# Patient Record
Sex: Female | Born: 2000 | Race: Black or African American | Hispanic: No | Marital: Single | Smoking: Never smoker
Health system: Southern US, Community
[De-identification: ages and names within clinical notes are randomized; demographics above are authoritative.]

## PROBLEM LIST (undated history)

## (undated) DIAGNOSIS — Z789 Other specified health status: Secondary | ICD-10-CM

## (undated) HISTORY — PX: OTHER SURGICAL HISTORY: SHX169

## (undated) HISTORY — DX: Other specified health status: Z78.9

---
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
## 2018-01-18 ENCOUNTER — Ambulatory Visit
Admission: EM | Admit: 2018-01-18 | Discharge: 2018-01-18 | Disposition: A | Discharge status: Home / Self Care | Payer: Medicaid Other | Attending: Family Medicine | Admitting: Family Medicine

## 2018-01-18 ENCOUNTER — Encounter: Payer: Self-pay | Admitting: *Deleted

## 2018-01-18 DIAGNOSIS — R112 Nausea with vomiting, unspecified: Secondary | ICD-10-CM

## 2018-01-18 DIAGNOSIS — Z87891 Personal history of nicotine dependence: Secondary | ICD-10-CM | POA: Insufficient documentation

## 2018-01-18 DIAGNOSIS — J029 Acute pharyngitis, unspecified: Secondary | ICD-10-CM | POA: Diagnosis present

## 2018-01-18 DIAGNOSIS — J069 Acute upper respiratory infection, unspecified: Secondary | ICD-10-CM | POA: Insufficient documentation

## 2018-01-18 DIAGNOSIS — R0981 Nasal congestion: Secondary | ICD-10-CM | POA: Diagnosis present

## 2018-01-18 LAB — RAPID STREP SCREEN (MED CTR MEBANE ONLY): Streptococcus, Group A Screen (Direct): NEGATIVE

## 2018-01-18 MED ORDER — BENZONATATE 100 MG PO CAPS: 100.0000 mg | ORAL_CAPSULE | Freq: Three times a day (TID) | ORAL | 0 refills | Status: DC | PRN

## 2018-01-18 NOTE — Discharge Instructions (Signed)
Take medication as prescribed. Over-the-counter medication as discussed. Rest. Drink plenty of fluids.   Follow up with your primary care physician this week as needed. Return to Urgent care for new or worsening concerns.   

## 2018-01-18 NOTE — ED Triage Notes (Signed)
Sore throat, N/V, head congestion, x3 days.

## 2018-01-18 NOTE — ED Provider Notes (Signed)
[facility] ____________________________________________  Time seen: Approximately 3:00 PM  I have reviewed the triage vital signs and the nursing notes.   HISTORY  Chief Complaint Nasal Congestion; Nausea; Emesis; and Sore Throat   HPI Alison Hebert is a 17 y.o. female  presenting with family at bedside for evaluation of 3.5 days of runny nose, nasal congestion, sore throat, body aches and overall not feeling well.  Patient reports some nausea, no vomiting.  Denies diarrhea or abdominal pain.  Continues with normal bowel movements.Reports current generalized body aches.  States sore throat is currently mild and scratchy.  Reports some intermittent bilateral ear discomfort.  Denies home sick contacts.  Continues to drink fluids well, decreased appetite.  States did take some over-the-counter combination cough and congestion medication that may have had Tylenol in it over the last 2 days.  No over-the-counter medication taken just prior to arrival.  Denies other aggravating or alleviating factors.Denies chest pain, shortness of breath, abdominal pain, dysuria, extremity pain, extremity swelling or rash. Denies recent sickness. Denies recent antibiotic use.   Last menstrual: 3 weeks ago.  Denies pregnancy.    History reviewed. No pertinent past medical history.  There are no active problems to display for this patient.   History reviewed. No pertinent surgical history.   No current facility-administered medications for this encounter.   Current Outpatient Medications:  .  benzonatate (TESSALON PERLES) 100 MG capsule, Take 1 capsule (100 mg total) by mouth 3 (three) times daily as needed for cough., Disp: 15 capsule, Rfl: 0  Allergies Patient has no known allergies.  Family History  Problem Relation Age of Onset  . Hypertension Mother   . Hypertension Father     Social History Social History   Tobacco Use  . Smoking status: Former Games developermoker  . Smokeless tobacco:  Never Used  Substance Use Topics  . Alcohol use: No    Frequency: Never  . Drug use: No    Review of Systems Constitutional: No fever/chills ENT: As above Cardiovascular: Denies chest pain. Respiratory: Denies shortness of breath. Gastrointestinal: No abdominal pain. No vomiting.  No diarrhea.  No constipation. Genitourinary: Negative for dysuria. Musculoskeletal: Negative for back pain. Skin: Negative for rash.   ____________________________________________   PHYSICAL EXAM:  VITAL SIGNS: ED Triage Vitals  Enc Vitals Group     BP 01/18/18 1442 129/89     Pulse Rate 01/18/18 1442 90     Resp 01/18/18 1442 16     Temp 01/18/18 1442 97.7 F (36.5 C)     Temp Source 01/18/18 1442 Oral     SpO2 01/18/18 1442 100 %     Weight 01/18/18 1444 130 lb (59 kg)     Height 01/18/18 1444 5\' 1"  (1.549 m)     Head Circumference --      Peak Flow --      Pain Score --      Pain Loc --      Pain Edu? --      Excl. in GC? --     Constitutional: Alert and oriented. Well appearing and in no acute distress. Eyes: Conjunctivae are normal.  Head: Atraumatic. No sinus tenderness to palpation. No swelling. No erythema.  Ears: no erythema, normal TMs bilaterally.   Nose:Nasal congestion with clear rhinorrhea  Mouth/Throat: Mucous membranes are moist. No pharyngeal erythema. No tonsillar swelling or exudate.  Neck: No stridor.  No cervical spine tenderness to palpation. Hematological/Lymphatic/Immunilogical: No cervical lymphadenopathy. Cardiovascular: Normal rate, regular rhythm.  Grossly normal heart sounds.  Good peripheral circulation. Respiratory: Normal respiratory effort.  No retractions. No wheezes, rales or rhonchi. Good air movement.  Gastrointestinal: Soft and nontender.  Musculoskeletal: Ambulatory with steady gait. No cervical, thoracic or lumbar tenderness to palpation. Neurologic:  Normal speech and language. No gait instability. Skin:  Skin appears warm, dry and intact. No  rash noted. Psychiatric: Mood and affect are normal. Speech and behavior are normal.  ___________________________________________   LABS (all labs ordered are listed, but only abnormal results are displayed)  Labs Reviewed  RAPID STREP SCREEN (NOT AT [facility])  CULTURE, GROUP A STREP [facility])    PROCEDURES Procedures    INITIAL IMPRESSION / ASSESSMENT AND PLAN / ED COURSE  Pertinent labs & imaging results that were available during my care of the patient were reviewed by me and considered in my medical decision making (see chart for details).  Well-appearing patient.  No acute distress.  Suspect viral illness.  Discussed some concern of influenza-like illness, however his symptoms have been persisting for 3.5 days, doubt benefit with Tamiflu.  Quick strep negative, will culture.  Discussed supportive care, rest, fluids.  PRN Tessalon Perles and over-the-counter decongestants.Discussed indication, risks and benefits of medications with patient.  Discussed follow up with Primary care physician this week. Discussed follow up and return parameters including no resolution or any worsening concerns. Patient verbalized understanding and agreed to plan.   ____________________________________________   FINAL CLINICAL IMPRESSION(S) / ED DIAGNOSES  Final diagnoses:  Upper respiratory tract infection, unspecified type     ED Discharge Orders        Ordered    benzonatate (TESSALON PERLES) 100 MG capsule  3 times daily PRN     01/18/18 1503       Note: This dictation was prepared with Dragon dictation along with smaller phrase technology. Any transcriptional errors that result from this process are unintentional.         Renford Dills, NP 01/18/18 1526

## 2018-01-21 LAB — CULTURE, GROUP A STREP (THRC)

## 2018-06-03 ENCOUNTER — Other Ambulatory Visit: Payer: Self-pay

## 2018-06-03 ENCOUNTER — Emergency Department
Admission: EM | Admit: 2018-06-03 | Discharge: 2018-06-03 | Disposition: A | Discharge status: Home / Self Care | Payer: Medicaid Other | Attending: Emergency Medicine | Admitting: Emergency Medicine

## 2018-06-03 DIAGNOSIS — R21 Rash and other nonspecific skin eruption: Secondary | ICD-10-CM | POA: Diagnosis not present

## 2018-06-03 DIAGNOSIS — Z87891 Personal history of nicotine dependence: Secondary | ICD-10-CM | POA: Diagnosis not present

## 2018-06-03 MED ORDER — PERMETHRIN 5 % EX CREA: TOPICAL_CREAM | CUTANEOUS | 1 refills | Status: DC

## 2018-06-03 MED ORDER — DIPHENHYDRAMINE HCL 25 MG PO CAPS: 25.0000 mg | ORAL_CAPSULE | ORAL | 0 refills | Status: DC | PRN

## 2018-06-03 MED ORDER — PREDNISONE 10 MG PO TABS: ORAL_TABLET | ORAL | 0 refills | Status: DC

## 2018-06-03 NOTE — ED Provider Notes (Signed)
[facility] Emergency Department Provider Note  ____________________________________________  Time seen: Approximately 2:08 PM  I have reviewed the triage vital signs and the nursing notes.   HISTORY  Chief Complaint Rash    HPI Alison Hebert is a 17 y.o. female that presents emergency department for evaluation of itchy bumps to left arm.  Patient noticed the rash while at work.  She folds clothes at work.  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  She was outside when she noticed the rash.  No known insect bites. No poison ivy. No new lotions, detergents, body washes, medications.    No past medical history on file.  There are no active problems to display for this patient.   No past surgical history on file.  Prior to Admission medications   Medication Sig Start Date End Date Taking? Authorizing Provider  benzonatate (TESSALON PERLES) 100 MG capsule Take 1 capsule (100 mg total) by mouth 3 (three) times daily as needed for cough. 01/18/18   Renford Dills, NP  diphenhydrAMINE (BENADRYL) 25 mg capsule Take 1 capsule (25 mg total) by mouth every 4 (four) hours as needed. 06/03/18 06/03/19  Enid Derry, PA-C  permethrin (ELIMITE) 5 % cream Message cream thoroughly into skin from neck to soles of feet, including under nails and to scalp and face. Wash after 8-14 hours. Repeat in 2 weeks. 06/03/18   Enid Derry, PA-C  predniSONE (DELTASONE) 10 MG tablet Take 6 tablets on day 1, take 5 tablets on day 2, take 4 tablets on day 3, take 3 tablets on day 4, take 2 tablets on day 5, take 1 tablet on day 6 06/03/18   Enid Derry, PA-C    Allergies Patient has no known allergies.  Family History  Problem Relation Age of Onset  . Hypertension Mother   . Hypertension Father     Social History Social History   Tobacco Use  . Smoking status: Former Games developer  . Smokeless tobacco: Never Used  Substance Use Topics  . Alcohol use: No    Frequency: Never  . Drug  use: No     Review of Systems  Constitutional: No fever/chills Cardiovascular: No chest pain. Respiratory:  No SOB. Gastrointestinal: No nausea, no vomiting.  Musculoskeletal: Negative for musculoskeletal pain. Skin: Negative for abrasions, lacerations, ecchymosis. Positive for rash.   ____________________________________________   PHYSICAL EXAM:  VITAL SIGNS: ED Triage Vitals [06/03/18 0910]  Enc Vitals Group     BP 140/78     Pulse Rate 74     Resp 18     Temp 97.9 F (36.6 C)     Temp Source Oral     SpO2 100 %     Weight 170 lb (77.1 kg)     Height 5\' 1"  (1.549 m)     Head Circumference      Peak Flow      Pain Score 0     Pain Loc      Pain Edu?      Excl. in GC?      Constitutional: Alert and oriented. Well appearing and in no acute distress. Eyes: Conjunctivae are normal. PERRL. EOMI. Head: Atraumatic. ENT:      Ears:      Nose: No congestion/rhinnorhea.      Mouth/Throat: Mucous membranes are moist.  Neck: No stridor.   Cardiovascular: Normal rate, regular rhythm.  Good peripheral circulation. Respiratory: Normal respiratory effort without tachypnea or retractions. Lungs CTAB. Good air entry to the  bases with no decreased or absent breath sounds. Musculoskeletal: Full range of motion to all extremities. No gross deformities appreciated. Neurologic:  Normal speech and language. No gross focal neurologic deficits are appreciated.  Skin:  Skin is warm, dry and intact. Few pinpoint flesh colored papules scattered to left arm.  Psychiatric: Mood and affect are normal. Speech and behavior are normal. Patient exhibits appropriate insight and judgement.   ____________________________________________   LABS (all labs ordered are listed, but only abnormal results are displayed)  Labs Reviewed - No data to display ____________________________________________  EKG   ____________________________________________  RADIOLOGY   No results  found.  ____________________________________________    PROCEDURES  Procedure(s) performed:    Procedures    Medications - No data to display   ____________________________________________   INITIAL IMPRESSION / ASSESSMENT AND PLAN / ED COURSE  Pertinent labs & imaging results that were available during my care of the patient were reviewed by me and considered in my medical decision making (see chart for details).  Review of the [place] CSRS was performed in accordance of the [facility] prior to dispensing any controlled drugs.     Patient's diagnosis is consistent with contact dermatitis  Vital signs and exam are reassuring.  Rash could also be insect bites.  Patient will begin prednisone and Benadryl for symptoms.   We discussed possible scabies but less likely given duration of rash.  She will try [place] cream if rash does not improve with prednisone and Benadryl.   Patient is to follow up with dermatology as directed. Patient is given ED precautions to return to the ED for any worsening or new symptoms.     ____________________________________________  FINAL CLINICAL IMPRESSION(S) / ED DIAGNOSES  Final diagnoses:  Rash and nonspecific skin eruption      NEW MEDICATIONS STARTED DURING THIS VISIT:     This chart was dictated using voice recognition software/Dragon. Despite best efforts to proofread, errors can occur which can change the meaning. Any change was purely unintentional.    Enid DerryWagner, Hermann Dottavio, PA-C 06/04/18 1433    Pershing ProudSchaevitz, Myra Rudeavid Matthew, MD 06/04/18 1500

## 2018-06-03 NOTE — ED Triage Notes (Signed)
Pt reports noticing a rash to her left forearm and left shoulder that started around 0400, small bumps noted, denies any known bites or stings, states that the area itches and burns

## 2018-06-03 NOTE — ED Notes (Signed)
Patient presents to the ED with rash to left arm radiating up to her left shoulder.  Patient states she first noticed area around 4am.  Patient reports itching and stinging.  Patient is in no obvious distress at this time.  Denies new soaps or detergents or foods.  Will continue to monitor.

## 2018-10-22 ENCOUNTER — Emergency Department
Admission: EM | Admit: 2018-10-22 | Discharge: 2018-10-22 | Disposition: A | Discharge status: Home / Self Care | Payer: Medicaid Other | Attending: Emergency Medicine | Admitting: Emergency Medicine

## 2018-10-22 DIAGNOSIS — H66001 Acute suppurative otitis media without spontaneous rupture of ear drum, right ear: Secondary | ICD-10-CM | POA: Insufficient documentation

## 2018-10-22 MED ORDER — AMOXICILLIN 500 MG PO TABS: 500.0000 mg | ORAL_TABLET | Freq: Three times a day (TID) | ORAL | 0 refills | Status: DC

## 2018-10-22 MED ORDER — NAPROXEN 500 MG PO TABS: 500.0000 mg | ORAL_TABLET | Freq: Two times a day (BID) | ORAL | 0 refills | Status: DC

## 2018-10-22 NOTE — Discharge Instructions (Signed)
Please follow up with the primary care provider for symptoms that are not improving over the next few days.  Return to the ER for symptoms that change or worsen if unable to schedule an appointment. 

## 2018-10-22 NOTE — ED Provider Notes (Signed)
[facility] Emergency Department Provider Note ____________________________________________  Time seen: Approximately 7:37 AM  I have reviewed the triage vital signs and the nursing notes.   HISTORY  Chief Complaint Otalgia and Nasal Congestion    HPI Alison Hebert is a 17 y.o. female who presents to the emergency department for treatment and evaluation of right ear pain that started around 1230.  No drainage from the ear.  She has had sinus congestion for the past week.  No fever. No alleviating measures attempted prior to arrival.   History reviewed. No pertinent past medical history.  There are no active problems to display for this patient.   History reviewed. No pertinent surgical history.  Prior to Admission medications   Medication Sig Start Date End Date Taking? Authorizing Provider  amoxicillin (AMOXIL) 500 MG tablet Take 1 tablet (500 mg total) by mouth 3 (three) times daily. 10/22/18   Marice Guidone B, FNP  benzonatate (TESSALON PERLES) 100 MG capsule Take 1 capsule (100 mg total) by mouth 3 (three) times daily as needed for cough. 01/18/18   Renford DillsMiller, Lindsey, NP  diphenhydrAMINE (BENADRYL) 25 mg capsule Take 1 capsule (25 mg total) by mouth every 4 (four) hours as needed. 06/03/18 06/03/19  Enid DerryWagner, Ashley, PA-C  naproxen (NAPROSYN) 500 MG tablet Take 1 tablet (500 mg total) by mouth 2 (two) times daily with a meal. 10/22/18   Tahmir Kleckner B, FNP  permethrin (ELIMITE) 5 % cream Message cream thoroughly into skin from neck to soles of feet, including under nails and to scalp and face. Wash after 8-14 hours. Repeat in 2 weeks. 06/03/18   Enid DerryWagner, Ashley, PA-C  predniSONE (DELTASONE) 10 MG tablet Take 6 tablets on day 1, take 5 tablets on day 2, take 4 tablets on day 3, take 3 tablets on day 4, take 2 tablets on day 5, take 1 tablet on day 6 06/03/18   Enid DerryWagner, Ashley, PA-C    Allergies Patient has no known allergies.  Family History  Problem Relation Age  of Onset  . Hypertension Mother   . Hypertension Father     Social History Social History   Tobacco Use  . Smoking status: Never Smoker  . Smokeless tobacco: Never Used  Substance Use Topics  . Alcohol use: No    Frequency: Never  . Drug use: No    Review of Systems Constitutional: Negative for fever. Negative for decreased ability to hear from right or left ear(s). Eyes: Negative for discharge or drainage. ENT:       Positive for otalgia in right ear(s).      Negative for rhinorrhea or congestion.      Negative for sore throat. Gastrointestinal: Negative for nausea, vomiting, or diarrhea. Musculoskeletal: Negative for myalgias. Skin: Negative for rash, lesions, or wounds. Neurological: Negative for paresthesias. ____________________________________________   PHYSICAL EXAM:  VITAL SIGNS: ED Triage Vitals  Enc Vitals Group     BP 10/22/18 0454 (!) 143/79     Pulse Rate 10/22/18 0454 81     Resp 10/22/18 0454 15     Temp 10/22/18 0454 99 F (37.2 C)     Temp Source 10/22/18 0454 Oral     SpO2 10/22/18 0454 99 %     Weight 10/22/18 0455 165 lb (74.8 kg)     Height 10/22/18 0455 5\' 2"  (1.575 m)     Head Circumference --      Peak Flow --      Pain Score 10/22/18 0455 7  Pain Loc --      Pain Edu? --      Excl. in GC? --     Constitutional: Well appearing. Eyes: Conjunctivae are clear without discharge or drainage. Ears:       Right TM: Dull, erythematous.      Left TM: Normal. Head: Atraumatic. Nose: No rhinorrhea or sinus pain on percussion. Mouth/Throat: Oropharynx normal. Tonsils without without exudate. Hematological/Lymphatic/Immunilogical: No palpable anterior cervical lymphadenopathy. Cardiovascular: Heart rate and rhythm are regular without murmur, gallop, or rub appreciated. Respiratory: Breath sounds are clear throughout to auscultation.  Neurologic:  Alert and oriented x 4. Skin: Intact and without rash, lesion, or wound on exposed skin  surfaces. ____________________________________________   LABS (all labs ordered are listed, but only abnormal results are displayed)  Labs Reviewed - No data to display ____________________________________________   RADIOLOGY  Not indicated ____________________________________________   PROCEDURES  Procedure(s) performed:   Procedures  ____________________________________________   INITIAL IMPRESSION / ASSESSMENT AND PLAN / ED COURSE  17 year old female presenting to the emergency department for treatment and evaluation of right earache that awakened her early this morning.  She is had a URI for the past several days, but the earache did not start until this morning.  Exam is consistent with a right otitis media.  She will be treated with amoxicillin and Naprosyn.  She was advised to follow-up with her primary care provider for symptoms that are not improving over the next few days.  She was advised to return to the emergency department for symptoms change or worsen if she is unable to schedule an appointment.  Pertinent labs & imaging results that were available during my care of the patient were reviewed by me and considered in my medical decision making (see chart for details). ____________________________________________   FINAL CLINICAL IMPRESSION(S) / ED DIAGNOSES  Final diagnoses:  Non-recurrent acute suppurative otitis media of right ear without spontaneous rupture of tympanic membrane    ED Discharge Orders         Ordered    amoxicillin (AMOXIL) 500 MG tablet  3 times daily     10/22/18 0745    naproxen (NAPROSYN) 500 MG tablet  2 times daily with meals     10/22/18 0745          If controlled substance prescribed during this visit, 12 month history viewed on the [place] prior to issuing an initial prescription for Schedule II or III opiod.   Note:  This document was prepared using Dragon voice recognition software and may include unintentional dictation  errors.     Chinita Pester, FNP 10/22/18 4098    Governor Rooks, MD 10/22/18 [phone]

## 2018-10-22 NOTE — ED Notes (Signed)
Right ear pain 8/10. No obvious drainage. Denies injury.

## 2018-10-22 NOTE — ED Triage Notes (Signed)
Patient c/o left ear pain that woke her from sleep at 030 today. Patient denies drainage from ear. Patient c/o sinus congestion X 1 week.

## 2019-01-10 DIAGNOSIS — Z113 Encounter for screening for infections with a predominantly sexual mode of transmission: Secondary | ICD-10-CM | POA: Diagnosis not present

## 2019-01-10 DIAGNOSIS — Z1388 Encounter for screening for disorder due to exposure to contaminants: Secondary | ICD-10-CM | POA: Diagnosis not present

## 2019-01-10 DIAGNOSIS — Z0389 Encounter for observation for other suspected diseases and conditions ruled out: Secondary | ICD-10-CM | POA: Diagnosis not present

## 2019-01-10 DIAGNOSIS — Z3009 Encounter for other general counseling and advice on contraception: Secondary | ICD-10-CM | POA: Diagnosis not present

## 2019-01-10 DIAGNOSIS — Z3049 Encounter for surveillance of other contraceptives: Secondary | ICD-10-CM | POA: Diagnosis not present

## 2019-01-10 LAB — HM HIV SCREENING LAB: HM HIV Screening: NEGATIVE

## 2019-05-08 DIAGNOSIS — Z3009 Encounter for other general counseling and advice on contraception: Secondary | ICD-10-CM | POA: Diagnosis not present

## 2019-05-08 DIAGNOSIS — Z0389 Encounter for observation for other suspected diseases and conditions ruled out: Secondary | ICD-10-CM | POA: Diagnosis not present

## 2019-05-08 DIAGNOSIS — Z1388 Encounter for screening for disorder due to exposure to contaminants: Secondary | ICD-10-CM | POA: Diagnosis not present

## 2019-11-26 ENCOUNTER — Ambulatory Visit: Payer: Medicaid Other

## 2019-12-19 ENCOUNTER — Other Ambulatory Visit: Payer: Self-pay

## 2019-12-24 ENCOUNTER — Ambulatory Visit (LOCAL_COMMUNITY_HEALTH_CENTER): Payer: Medicaid Other

## 2019-12-24 ENCOUNTER — Other Ambulatory Visit: Payer: Self-pay

## 2019-12-24 VITALS — BP 128/82 | Ht 62.0 in | Wt 181.0 lb

## 2019-12-24 DIAGNOSIS — Z3201 Encounter for pregnancy test, result positive: Secondary | ICD-10-CM

## 2019-12-24 LAB — PREGNANCY, URINE: Preg Test, Ur: POSITIVE — AB

## 2019-12-24 NOTE — Progress Notes (Signed)
Pt unsure of prenatal caregiver; sent to preadmit. 

## 2020-01-02 DIAGNOSIS — E669 Obesity, unspecified: Secondary | ICD-10-CM

## 2020-01-02 DIAGNOSIS — Z6830 Body mass index (BMI) 30.0-30.9, adult: Secondary | ICD-10-CM

## 2020-01-08 ENCOUNTER — Ambulatory Visit: Payer: Self-pay | Admitting: Family Medicine

## 2020-01-08 ENCOUNTER — Other Ambulatory Visit: Payer: Self-pay | Admitting: Family Medicine

## 2020-01-08 ENCOUNTER — Other Ambulatory Visit: Payer: Self-pay

## 2020-01-08 VITALS — BP 110/66 | HR 75 | Temp 97.8°F | Wt 182.2 lb

## 2020-01-08 DIAGNOSIS — O99211 Obesity complicating pregnancy, first trimester: Secondary | ICD-10-CM | POA: Insufficient documentation

## 2020-01-08 DIAGNOSIS — Z1331 Encounter for screening for depression: Secondary | ICD-10-CM | POA: Diagnosis not present

## 2020-01-08 DIAGNOSIS — Z3400 Encounter for supervision of normal first pregnancy, unspecified trimester: Secondary | ICD-10-CM | POA: Diagnosis not present

## 2020-01-08 LAB — URINALYSIS
Bilirubin, UA: NEGATIVE
Glucose, UA: NEGATIVE
Ketones, UA: NEGATIVE
Leukocytes,UA: NEGATIVE
Nitrite, UA: NEGATIVE
Protein,UA: NEGATIVE
RBC, UA: NEGATIVE
Specific Gravity, UA: 1.02 (ref 1.005–1.030)
Urobilinogen, Ur: 1 mg/dL (ref 0.2–1.0)
pH, UA: 7 (ref 5.0–7.5)

## 2020-01-08 LAB — WET PREP FOR TRICH, YEAST, CLUE
Trichomonas Exam: NEGATIVE
Yeast Exam: NEGATIVE

## 2020-01-08 LAB — HEMOGLOBIN, FINGERSTICK: Hemoglobin: 12.6 g/dL (ref 11.1–15.9)

## 2020-01-08 LAB — OB RESULTS CONSOLE HIV ANTIBODY (ROUTINE TESTING): HIV: NONREACTIVE

## 2020-01-08 NOTE — Progress Notes (Signed)
[place] [id number] [phone]  INITIAL PRENATAL VISIT NOTE  Subjective:  Alison Hebert is a 19 y.o. G1P0000 at [redacted]w[redacted]d being seen today to start prenatal care at the [facility] Department.  She is currently monitored for the following issues for this low-risk pregnancy and has Obesity complicating pregnancy in first trimester; Supervision of normal first pregnancy, antepartum; and Positive depression screening on their problem list.  Pt is here for initial OB visit. She lives with her mom and just started work at [employer]. The FOB will not be involved in this pregnancy. Physically she is feeling ok, states nausea is resolving. She denies chronic medical issues, takes no medications other than PNV, has no allergies to medications. No hx of pap d/t age <65. She does not drink or use drugs. She used to smoke Black and Milds about every other day but stopped when she learned she was pregnant. She elects for 1st trimester screen.   PHQ-9 score is 11. She endorses stress with this pregnancy and would like to speak with a counselor.      Contractions: Not present. Vag. Bleeding: None.  Movement: Absent. Denies leaking of fluid.   Indications for ASA therapy (per uptodate) One of the following: Previous pregnancy with preeclampsia, especially early onset and with an adverse outcome No Multifetal gestation No Chronic hypertension No Type 1 or 2 diabetes mellitus No Chronic kidney disease No Autoimmune disease (antiphospholipid syndrome, systemic lupus erythematosus) No  Two or more of the following: Nulliparity Yes Obesity (body mass index >30 kg/m2) Yes Family history of preeclampsia in mother or sister No Age ?35 years No Sociodemographic characteristics (African American race, low socioeconomic level) Yes Personal risk factors (eg, previous pregnancy with low birth weight or  small for gestational age infant, previous adverse pregnancy outcome [eg, stillbirth], interval >10 years between pregnancies) No   The following portions of the patient's history were reviewed and updated as appropriate: allergies, current medications, past family history, past medical history, past social history, past surgical history and problem list. Problem list updated.  Objective:   Vitals:   01/08/20 0857  BP: 110/66  Pulse: 75  Temp: 97.8 F (36.6 C)  Weight: 182 lb 3.2 oz (82.6 kg)    Fetal Status: Fetal Heart Rate (bpm): unable to hear Fundal Height: 12 cm Movement: Absent  Presentation: Undeterminable   Physical Exam Vitals and nursing note reviewed.  Constitutional:      General: She is not in acute distress.    Appearance: Normal appearance. She is well-developed.  HENT:     Head: Normocephalic and atraumatic.     Right Ear: External ear normal.     Left Ear: External ear normal.     Nose: Nose normal. No congestion or rhinorrhea.     Mouth/Throat:     Lips: Pink.     Mouth: Mucous membranes are moist.     Dentition: Normal dentition. No dental caries.     Pharynx: Oropharynx is clear. Uvula midline.  Eyes:     General: No scleral icterus.    Conjunctiva/sclera: Conjunctivae normal.  Neck:     Thyroid: No thyroid mass or thyromegaly.  Cardiovascular:     Rate and Rhythm: Normal rate.     Pulses: Normal pulses.     Comments: Extremities are warm and well perfused Pulmonary:     Effort: Pulmonary effort is normal.     Breath  sounds: Normal breath sounds.  Chest:     Breasts: Breasts are symmetrical.        Right: Normal. No mass, nipple discharge or skin change.        Left: Normal. No mass, nipple discharge or skin change.  Abdominal:     General: Abdomen is flat.     Palpations: Abdomen is soft.     Tenderness: There is no abdominal tenderness.     Comments: Gravid   Genitourinary:    General: Normal vulva.     Exam position: Lithotomy position.      Pubic Area: No rash.      Labia:        Right: No rash.        Left: No rash.      Vagina: Vaginal discharge (scant, white) present.     Cervix: No cervical motion tenderness or friability.     Uterus: Normal. Enlarged (Gravid 12 wk size). Not tender.      Adnexa: Right adnexa normal and left adnexa normal.     Rectum: Normal. No external hemorrhoid.  Musculoskeletal:     Right lower leg: No edema.     Left lower leg: No edema.  Lymphadenopathy:     Upper Body:     Right upper body: No axillary adenopathy.     Left upper body: No axillary adenopathy.  Skin:    General: Skin is warm.     Capillary Refill: Capillary refill takes less than 2 seconds.  Neurological:     Mental Status: She is alert.     Assessment and Plan:  Pregnancy: G1P0000 at [redacted]w[redacted]d   1. Supervision of normal first pregnancy, antepartum -Initial prenatal visit today. -Unable to hear FHT and pt elects 1st trimester genetic screen, referral placed for viability/1st trim [place] today.  -Pap n/a d/t age - HGB FRAC. W/SOLUBILITY - HIV [place] [facility] - Prenatal profile without Varicella/Rubella ([id number]) - Urine Culture - Chlamydia/GC NAA, Confirmation - WET PREP FOR TRICH, YEAST, CLUE - Hemoglobin, venipuncture - Urinalysis (Urine Dip)  2. Obesity complicating pregnancy in first trimester -Early GTT today, add'l labs as listed. -Healthy weight gain discussed. Pt accepts MNT referral, placed today -Pt accepts aspirin at 12 wks, handout given. -May consider serial growth scans starting at 28 wks. - Glucose, 1 hour gestational - Hgb A1c w/o eAG - Comprehensive metabolic panel - Protein / creatinine ratio, urine  (Spot) - TSH - Amb ref to Medical Nutrition Therapy-MNT  3. Positive depression screening Upon flowsheet questioning pt endorses stress w/pregnancy and would like to speak with a counselor. Will refer to behavioral health.  - Ambulatory referral to Behavioral Health    Discussed overview of care  and coordination with inpatient delivery practices including [facility], [facility], [facility] and [facility].     Preterm labor symptoms and general obstetric precautions including but not limited to vaginal bleeding, contractions, leaking of fluid and fetal movement were reviewed in detail with the patient.  Please refer to After Visit Summary for other counseling recommendations.   Return in about 4 weeks (around 02/05/2020).  Future Appointments  Date Time Provider Department Center  02/05/2020  8:40 AM AC-MH PROVIDER AC-[facility] None    Ann Held, PA-C

## 2020-01-08 NOTE — Progress Notes (Addendum)
Here today for 12.5 week MH IP appt. Taking PNV QD, denies ED/hospital visits since +PT. Declines Flu vaccine. Early 1 hour GTT today.  Tawny Hopping, RN

## 2020-01-09 LAB — PROTEIN / CREATININE RATIO, URINE
Creatinine, Urine: 92.5 mg/dL
Protein, Ur: 7.8 mg/dL
Protein/Creat Ratio: 84 mg/g creat (ref 0–200)

## 2020-01-10 LAB — COMPREHENSIVE METABOLIC PANEL
ALT: 18 IU/L (ref 0–32)
AST: 20 IU/L (ref 0–40)
Albumin/Globulin Ratio: 1.4 (ref 1.2–2.2)
Albumin: 4.3 g/dL (ref 3.9–5.0)
Alkaline Phosphatase: 76 IU/L (ref 39–117)
BUN/Creatinine Ratio: 8 — ABNORMAL LOW (ref 9–23)
BUN: 5 mg/dL — ABNORMAL LOW (ref 6–20)
Bilirubin Total: <0.2 mg/dL (ref 0.0–1.2)
CO2: 22 mmol/L (ref 20–29)
Calcium: 10 mg/dL (ref 8.7–10.2)
Chloride: 104 mmol/L (ref 96–106)
Creatinine, Ser: 0.61 mg/dL (ref 0.57–1.00)
GFR calc Af Amer: 151 mL/min/{1.73_m2} (ref >59)
GFR calc non Af Amer: 131 mL/min/{1.73_m2} (ref >59)
Globulin, Total: 3.1 g/dL (ref 1.5–4.5)
Glucose: 76 mg/dL (ref 65–99)
Potassium: 4 mmol/L (ref 3.5–5.2)
Sodium: 141 mmol/L (ref 134–144)
Total Protein: 7.4 g/dL (ref 6.0–8.5)

## 2020-01-10 LAB — CBC/D/PLT+RPR+RH+ABO+AB SCR
Antibody Screen: NEGATIVE
Basophils Absolute: 0 10*3/uL (ref 0.0–0.2)
Basos: 0 %
EOS (ABSOLUTE): 0.1 10*3/uL (ref 0.0–0.4)
Eos: 2 %
Hematocrit: 38.8 % (ref 34.0–46.6)
Hemoglobin: 12.9 g/dL (ref 11.1–15.9)
Hepatitis B Surface Ag: NEGATIVE
Immature Grans (Abs): 0 10*3/uL (ref 0.0–0.1)
Immature Granulocytes: 0 %
Lymphocytes Absolute: 1.4 10*3/uL (ref 0.7–3.1)
Lymphs: 18 %
MCH: 27.4 pg (ref 26.6–33.0)
MCHC: 33.2 g/dL (ref 31.5–35.7)
MCV: 82 fL (ref 79–97)
Monocytes Absolute: 0.6 10*3/uL (ref 0.1–0.9)
Monocytes: 8 %
Neutrophils Absolute: 5.5 10*3/uL (ref 1.4–7.0)
Neutrophils: 72 %
Platelets: 265 10*3/uL (ref 150–450)
RBC: 4.71 x10E6/uL (ref 3.77–5.28)
RDW: 12.9 % (ref 11.7–15.4)
RPR Ser Ql: NONREACTIVE
Rh Factor: POSITIVE
WBC: 7.7 10*3/uL (ref 3.4–10.8)

## 2020-01-10 LAB — CHLAMYDIA/GC NAA, CONFIRMATION
Chlamydia trachomatis, NAA: NEGATIVE
Neisseria gonorrhoeae, NAA: NEGATIVE

## 2020-01-10 LAB — HGB FRAC. W/SOLUBILITY
Hgb A2 Quant: 1.8 % (ref 1.8–3.2)
Hgb A: 98.2 % (ref 96.4–98.8)
Hgb C: 0 %
Hgb F Quant: 0 % (ref 0.0–2.0)
Hgb S: 0 %
Hgb Solubility: NEGATIVE
Hgb Variant: 0 %

## 2020-01-10 LAB — HGB A1C W/O EAG: Hgb A1c MFr Bld: 5.2 % (ref 4.8–5.6)

## 2020-01-10 LAB — GLUCOSE, 1 HOUR GESTATIONAL: Gestational Diabetes Screen: 77 mg/dL (ref 65–139)

## 2020-01-10 LAB — URINE CULTURE

## 2020-01-10 LAB — TSH: TSH: 0.938 u[IU]/mL (ref 0.450–4.500)

## 2020-01-15 DIAGNOSIS — Z315 Encounter for genetic counseling: Secondary | ICD-10-CM | POA: Diagnosis not present

## 2020-01-15 DIAGNOSIS — Z3A13 13 weeks gestation of pregnancy: Secondary | ICD-10-CM | POA: Diagnosis not present

## 2020-01-15 DIAGNOSIS — Z36 Encounter for antenatal screening for chromosomal anomalies: Secondary | ICD-10-CM | POA: Diagnosis not present

## 2020-01-15 DIAGNOSIS — Z3401 Encounter for supervision of normal first pregnancy, first trimester: Secondary | ICD-10-CM | POA: Diagnosis not present

## 2020-01-16 DIAGNOSIS — R1084 Generalized abdominal pain: Secondary | ICD-10-CM | POA: Diagnosis not present

## 2020-01-16 DIAGNOSIS — R110 Nausea: Secondary | ICD-10-CM | POA: Diagnosis not present

## 2020-01-16 DIAGNOSIS — O3481 Maternal care for other abnormalities of pelvic organs, first trimester: Secondary | ICD-10-CM | POA: Diagnosis not present

## 2020-01-16 DIAGNOSIS — Z3A13 13 weeks gestation of pregnancy: Secondary | ICD-10-CM | POA: Diagnosis not present

## 2020-01-16 DIAGNOSIS — B9689 Other specified bacterial agents as the cause of diseases classified elsewhere: Secondary | ICD-10-CM | POA: Diagnosis not present

## 2020-01-16 DIAGNOSIS — O23591 Infection of other part of genital tract in pregnancy, first trimester: Secondary | ICD-10-CM | POA: Diagnosis not present

## 2020-01-16 DIAGNOSIS — N83201 Unspecified ovarian cyst, right side: Secondary | ICD-10-CM | POA: Diagnosis not present

## 2020-01-16 DIAGNOSIS — O30041 Twin pregnancy, dichorionic/diamniotic, first trimester: Secondary | ICD-10-CM | POA: Diagnosis not present

## 2020-01-16 DIAGNOSIS — O26851 Spotting complicating pregnancy, first trimester: Secondary | ICD-10-CM | POA: Diagnosis not present

## 2020-01-16 DIAGNOSIS — Z3A08 8 weeks gestation of pregnancy: Secondary | ICD-10-CM | POA: Diagnosis not present

## 2020-01-16 DIAGNOSIS — N760 Acute vaginitis: Secondary | ICD-10-CM | POA: Diagnosis not present

## 2020-01-21 DIAGNOSIS — O3680X1 Pregnancy with inconclusive fetal viability, fetus 1: Secondary | ICD-10-CM | POA: Diagnosis not present

## 2020-01-21 DIAGNOSIS — Z3A09 9 weeks gestation of pregnancy: Secondary | ICD-10-CM | POA: Diagnosis not present

## 2020-01-21 DIAGNOSIS — Z3401 Encounter for supervision of normal first pregnancy, first trimester: Secondary | ICD-10-CM | POA: Diagnosis not present

## 2020-01-21 DIAGNOSIS — O30041 Twin pregnancy, dichorionic/diamniotic, first trimester: Secondary | ICD-10-CM | POA: Diagnosis not present

## 2020-01-21 DIAGNOSIS — O3680X0 Pregnancy with inconclusive fetal viability, not applicable or unspecified: Secondary | ICD-10-CM | POA: Diagnosis not present

## 2020-01-31 ENCOUNTER — Ambulatory Visit: Payer: Medicaid Other | Admitting: Licensed Clinical Social Worker

## 2020-02-05 ENCOUNTER — Ambulatory Visit: Payer: Medicaid Other

## 2020-02-05 DIAGNOSIS — O99351 Diseases of the nervous system complicating pregnancy, first trimester: Secondary | ICD-10-CM | POA: Diagnosis not present

## 2020-02-05 DIAGNOSIS — R42 Dizziness and giddiness: Secondary | ICD-10-CM | POA: Diagnosis not present

## 2020-02-05 DIAGNOSIS — O26891 Other specified pregnancy related conditions, first trimester: Secondary | ICD-10-CM | POA: Diagnosis not present

## 2020-02-05 DIAGNOSIS — R519 Headache, unspecified: Secondary | ICD-10-CM | POA: Diagnosis not present

## 2020-02-05 DIAGNOSIS — Z3A10 10 weeks gestation of pregnancy: Secondary | ICD-10-CM | POA: Diagnosis not present

## 2020-02-05 DIAGNOSIS — G43909 Migraine, unspecified, not intractable, without status migrainosus: Secondary | ICD-10-CM | POA: Diagnosis not present

## 2020-02-07 ENCOUNTER — Other Ambulatory Visit: Payer: Self-pay

## 2020-02-07 ENCOUNTER — Ambulatory Visit: Payer: Medicaid Other | Admitting: Physician Assistant

## 2020-02-07 VITALS — BP 133/88 | HR 105 | Temp 98.9°F | Wt 185.2 lb

## 2020-02-07 DIAGNOSIS — Z3400 Encounter for supervision of normal first pregnancy, unspecified trimester: Secondary | ICD-10-CM | POA: Diagnosis not present

## 2020-02-07 DIAGNOSIS — O30041 Twin pregnancy, dichorionic/diamniotic, first trimester: Secondary | ICD-10-CM | POA: Diagnosis not present

## 2020-02-07 NOTE — Progress Notes (Signed)
   PRENATAL VISIT NOTE  Subjective:  Jennife Zaucha is a 19 y.o. G1P0000 at [redacted]w[redacted]d being seen today for ongoing prenatal care.  She is currently monitored for the following issues for this high-risk pregnancy and has Obesity complicating pregnancy in first trimester; Supervision of normal first pregnancy, antepartum; Positive depression screening; and Dichorionic diamniotic twin pregnancy in first trimester on their problem list.  Of note, pt went to [facility] for eval of abdominal discomfort and ended up having a transvag US on 01/16/20 which showed di/di twin IUP at [redacted]w[redacted]d. Records indicate she was to f/u at [facility] in 1 week, but she presents here for referral for transfer of care. No further abdominal pain, no nausea/vomiting/bleeding. Is aware she is carrying a twin pregnancy.  Patient reports no complaints.  Contractions: Not present. Vag. Bleeding: None.  Movement: Absent. Denies leaking of fluid/ROM.   The following portions of the patient's history were reviewed and updated as appropriate: allergies, current medications, past family history, past medical history, past social history, past surgical history and problem list. Problem list updated.  Objective:   Vitals:   02/07/20 1400  BP: 133/88  Pulse: (!) 105  Temp: 98.9 F (37.2 C)  Weight: 185 lb 3.2 oz (84 kg)    Fetal Status: Fetal Heart Rate (bpm): 148 Fundal Height: 13 cm Movement: Absent     General:  Alert, oriented and cooperative. Patient is in no acute distress.  Skin: Skin is warm and dry. No rash noted.   Cardiovascular: Normal heart rate noted  Respiratory: Normal respiratory effort, no problems with respiration noted  Abdomen: Soft, gravid, appropriate for gestational age.  Pain/Pressure: Absent     Pelvic: Cervical exam deferred        Extremities: Normal range of motion.  Edema: None  Mental Status: Normal mood and affect. Normal behavior. Normal judgment and thought content.   Assessment and Plan:  Pregnancy: G1P0000  at [redacted]w[redacted]d  1. Dichorionic diamniotic twin pregnancy in first trimester Transfer care to [facility] for ongoing prenatal care; referral form completed. Pt to expect call from [facility] to initiate prenatal care for high risk/twin pregnancy. Pt in agreement with this plan.   Preterm labor symptoms and general obstetric precautions including but not limited to vaginal bleeding, contractions, leaking of fluid and fetal movement were reviewed in detail with the patient. Please refer to After Visit Summary for other counseling recommendations.  No follow-ups on file.  No future appointments.  Landry Dyke, PA-C

## 2020-02-07 NOTE — Progress Notes (Addendum)
Here today for 11.4 week MH RV. Taking PNV and ASA QD. Went to [facility] 01/16/20 for abdominal pain and 02/05/20 for Migraines. Was given Tylenol. HA was relieved with Tylenol. Tawny Hopping, RN

## 2020-02-07 NOTE — Addendum Note (Signed)
Addended by: Heywood Bene on: 02/07/2020 11:09 AM   Modules accepted: Orders

## 2020-02-20 DIAGNOSIS — Z3A13 13 weeks gestation of pregnancy: Secondary | ICD-10-CM | POA: Diagnosis not present

## 2020-02-20 DIAGNOSIS — O0991 Supervision of high risk pregnancy, unspecified, first trimester: Secondary | ICD-10-CM | POA: Diagnosis not present

## 2020-02-20 DIAGNOSIS — Z3401 Encounter for supervision of normal first pregnancy, first trimester: Secondary | ICD-10-CM | POA: Diagnosis not present

## 2020-02-20 DIAGNOSIS — O30009 Twin pregnancy, unspecified number of placenta and unspecified number of amniotic sacs, unspecified trimester: Secondary | ICD-10-CM | POA: Diagnosis not present

## 2020-02-20 DIAGNOSIS — O30042 Twin pregnancy, dichorionic/diamniotic, second trimester: Secondary | ICD-10-CM | POA: Diagnosis not present

## 2020-02-20 DIAGNOSIS — O219 Vomiting of pregnancy, unspecified: Secondary | ICD-10-CM | POA: Diagnosis not present

## 2020-02-20 DIAGNOSIS — O30041 Twin pregnancy, dichorionic/diamniotic, first trimester: Secondary | ICD-10-CM | POA: Diagnosis not present

## 2020-03-31 ENCOUNTER — Other Ambulatory Visit: Payer: Self-pay

## 2020-03-31 ENCOUNTER — Encounter: Payer: Self-pay | Admitting: Emergency Medicine

## 2020-03-31 ENCOUNTER — Emergency Department: Payer: Medicaid Other

## 2020-03-31 ENCOUNTER — Emergency Department
Admission: EM | Admit: 2020-03-31 | Discharge: 2020-03-31 | Disposition: A | Discharge status: Home / Self Care | Payer: Medicaid Other | Attending: Emergency Medicine | Admitting: Emergency Medicine

## 2020-03-31 DIAGNOSIS — O26892 Other specified pregnancy related conditions, second trimester: Secondary | ICD-10-CM | POA: Insufficient documentation

## 2020-03-31 DIAGNOSIS — R1031 Right lower quadrant pain: Secondary | ICD-10-CM

## 2020-03-31 DIAGNOSIS — Z3A20 20 weeks gestation of pregnancy: Secondary | ICD-10-CM | POA: Insufficient documentation

## 2020-03-31 DIAGNOSIS — Z79899 Other long term (current) drug therapy: Secondary | ICD-10-CM | POA: Diagnosis not present

## 2020-03-31 DIAGNOSIS — Z7982 Long term (current) use of aspirin: Secondary | ICD-10-CM | POA: Insufficient documentation

## 2020-03-31 DIAGNOSIS — R109 Unspecified abdominal pain: Secondary | ICD-10-CM

## 2020-03-31 DIAGNOSIS — Z3A19 19 weeks gestation of pregnancy: Secondary | ICD-10-CM | POA: Diagnosis not present

## 2020-03-31 LAB — COMPREHENSIVE METABOLIC PANEL
ALT: 14 U/L (ref 0–44)
AST: 17 U/L (ref 15–41)
Albumin: 3.3 g/dL — ABNORMAL LOW (ref 3.5–5.0)
Alkaline Phosphatase: 66 U/L (ref 38–126)
Anion gap: 8 (ref 5–15)
BUN: 6 mg/dL (ref 6–20)
CO2: 21 mmol/L — ABNORMAL LOW (ref 22–32)
Calcium: 9.3 mg/dL (ref 8.9–10.3)
Chloride: 108 mmol/L (ref 98–111)
Creatinine, Ser: 0.5 mg/dL (ref 0.44–1.00)
GFR calc Af Amer: >60 mL/min (ref >60)
GFR calc non Af Amer: >60 mL/min (ref >60)
Glucose, Bld: 91 mg/dL (ref 70–99)
Potassium: 3.5 mmol/L (ref 3.5–5.1)
Sodium: 137 mmol/L (ref 135–145)
Total Bilirubin: 0.6 mg/dL (ref 0.3–1.2)
Total Protein: 7 g/dL (ref 6.5–8.1)

## 2020-03-31 LAB — CBC
HCT: 33.6 % — ABNORMAL LOW (ref 36.0–46.0)
Hemoglobin: 11.3 g/dL — ABNORMAL LOW (ref 12.0–15.0)
MCH: 27.4 pg (ref 26.0–34.0)
MCHC: 33.6 g/dL (ref 30.0–36.0)
MCV: 81.4 fL (ref 80.0–100.0)
Platelets: 237 10*3/uL (ref 150–400)
RBC: 4.13 MIL/uL (ref 3.87–5.11)
RDW: 12.9 % (ref 11.5–15.5)
WBC: 7.8 10*3/uL (ref 4.0–10.5)
nRBC: 0 % (ref 0.0–0.2)

## 2020-03-31 LAB — URINALYSIS, COMPLETE (UACMP) WITH MICROSCOPIC
Bilirubin Urine: NEGATIVE
Glucose, UA: NEGATIVE mg/dL
Hgb urine dipstick: NEGATIVE
Ketones, ur: NEGATIVE mg/dL
Nitrite: NEGATIVE
Protein, ur: NEGATIVE mg/dL
Specific Gravity, Urine: 1.027 (ref 1.005–1.030)
pH: 7 (ref 5.0–8.0)

## 2020-03-31 LAB — LIPASE, BLOOD: Lipase: 22 U/L (ref 11–51)

## 2020-03-31 LAB — HCG, QUANTITATIVE, PREGNANCY: hCG, Beta Chain, Quant, S: 17704 m[IU]/mL — ABNORMAL HIGH (ref <5)

## 2020-03-31 NOTE — ED Notes (Signed)
Pt reports lower abdominal pain but no vaginal bleeding or leaking of fluids from the vagina.  Pt states that her babies have been moving and are active. No distress noted

## 2020-03-31 NOTE — ED Provider Notes (Signed)
[facility] Emergency Department Provider Note  Time seen: 6:58 PM  I have reviewed the triage vital signs and the nursing notes.   HISTORY  Chief Complaint Abdominal Pain    HPI Alison Hebert is a 19 y.o. female G1 approximate [redacted] weeks pregnant with a twin pregnancy presents emergency department for lower abdominal discomfort.  According to the patient over the past 4 days or so she has been experiencing some lower abdominal discomfort more so on the right lower quadrant.  Patient denies any fever nausea vomiting.  Denies dysuria hematuria vaginal bleeding or discharge.  Patient follows up with [facility].  Patient continues to feel fetal movement.  Describes her discomfort as mild aching and intermittent.   Past Medical History:  Diagnosis Date  . Patient denies medical problems     Patient Active Problem List   Diagnosis Date Noted  . Dichorionic diamniotic twin pregnancy in first trimester 02/07/2020  . Obesity complicating pregnancy in first trimester 01/08/2020  . Supervision of normal first pregnancy, antepartum 01/08/2020  . Positive depression screening 01/08/2020    Past Surgical History:  Procedure Laterality Date  . denies      Prior to Admission medications   Medication Sig Start Date End Date Taking? Authorizing Provider  aspirin EC 81 MG tablet Take 81 mg by mouth daily.    [provider]  Prenatal Vit-Fe Fumarate-FA (PRENATAL VITAMINS PO) Take 1 tablet by mouth.    [provider]    No Known Allergies  Family History  Problem Relation Age of Onset  . Hypertension Mother   . Hypertension Father   . Seizures Maternal Grandmother   . Stroke Maternal Grandmother   . Neurologic Disorder Maternal Grandmother   . Hypertension Paternal Grandmother   . Hypertension Paternal Grandfather     Social History Social History   Tobacco Use  . Smoking status: Never Smoker  . Smokeless tobacco: Never Used  Substance  Use Topics  . Alcohol use: Not Currently  . Drug use: Never    Review of Systems Constitutional: Negative for fever. Cardiovascular: Negative for chest pain. Respiratory: Negative for shortness of breath. Gastrointestinal: Negative for abdominal pain, vomiting and diarrhea. Genitourinary: Negative for urinary compaints, vaginal bleeding or discharge. Musculoskeletal: Negative for musculoskeletal complaints Neurological: Negative for headache All other ROS negative  ____________________________________________   PHYSICAL EXAM:  VITAL SIGNS: ED Triage Vitals [03/31/20 1609]  Enc Vitals Group     BP 117/82     Pulse Rate (!) 103     Resp 20     Temp 98.4 F (36.9 C)     Temp Source Oral     SpO2 100 %     Weight 195 lb (88.5 kg)     Height 5\' 1"  (1.549 m)     Head Circumference      Peak Flow      Pain Score 10     Pain Loc      Pain Edu?      Excl. in GC?     Constitutional: Alert and oriented. Well appearing and in no distress. Eyes: Normal exam ENT      Head: Normocephalic and atraumatic.      Mouth/Throat: Mucous membranes are moist. Cardiovascular: Normal rate, regular rhythm.  Respiratory: Normal respiratory effort without tachypnea nor retractions. Breath sounds are clear  Gastrointestinal: Gravid abdomen, very slight right lower quadrant tenderness without rebound guarding or distention. Musculoskeletal: Nontender with normal range of motion in  all extremities Neurologic:  Normal speech and language. No gross focal neurologic deficits  Skin:  Skin is warm, dry and intact.  Psychiatric: Mood and affect are normal.   ____________________________________________     RADIOLOGY  Ultrasound shows normal twin pregnancy around 20 weeks with heart rates identified in both fetus.  Normal ovaries.  ____________________________________________   INITIAL IMPRESSION / ASSESSMENT AND PLAN / ED COURSE  Pertinent labs & imaging results that were available during  my care of the patient were reviewed by me and considered in my medical decision making (see chart for details).   Patient presents to the emergency department for lower abdominal pain, slight right lower quadrant tenderness on exam.  Patient's lab work is overall reassuring including a normal white blood cell count.  LFTs and lipase are normal.  Patient's ultrasound shows an overall normal appearance of a twin pregnancy.  Very slight right lower quadrant tenderness on exam.  I discussed with the patient return precautions for any worsening pain, fever.  Patient agreeable to plan of care.  Will follow up with her OB at [facility].  Alison Hebert was evaluated in Emergency Department on 03/31/2020 for the symptoms described in the history of present illness. She was evaluated in the context of the global COVID-19 pandemic, which necessitated consideration that the patient might be at risk for infection with the SARS-CoV-2 virus that causes COVID-19. Institutional protocols and algorithms that pertain to the evaluation of patients at risk for COVID-19 are in a state of rapid change based on information released by regulatory bodies including the CDC and federal and state organizations. These policies and algorithms were followed during the patient's care in the ED.  ____________________________________________   FINAL CLINICAL IMPRESSION(S) / ED DIAGNOSES  Abdominal pain during pregnancy    Harvest Dark, MD 03/31/20 1901

## 2020-03-31 NOTE — ED Notes (Signed)
This RN attempted to find fetal heart tones on patient's twins, I believe I heard appropriate heart beats in two different areas of patient's abdomen but neither for long enough to get an accurate heart rate count.  Patient is feeling fetal movement.

## 2020-04-02 LAB — URINE CULTURE

## 2020-04-07 DIAGNOSIS — Z363 Encounter for antenatal screening for malformations: Secondary | ICD-10-CM | POA: Diagnosis not present

## 2020-04-07 DIAGNOSIS — O30042 Twin pregnancy, dichorionic/diamniotic, second trimester: Secondary | ICD-10-CM | POA: Diagnosis not present

## 2020-04-07 DIAGNOSIS — Z3A20 20 weeks gestation of pregnancy: Secondary | ICD-10-CM | POA: Diagnosis not present

## 2020-04-16 ENCOUNTER — Telehealth (LOCAL_COMMUNITY_HEALTH_CENTER): Payer: Medicaid Other | Admitting: Dietician

## 2020-04-18 NOTE — Telephone Encounter (Signed)
Spoke with patient 04/16/20. Notes are in [facility] EMR.

## 2020-05-08 DIAGNOSIS — Z362 Encounter for other antenatal screening follow-up: Secondary | ICD-10-CM | POA: Diagnosis not present

## 2020-05-08 DIAGNOSIS — O30042 Twin pregnancy, dichorionic/diamniotic, second trimester: Secondary | ICD-10-CM | POA: Diagnosis not present

## 2020-05-08 DIAGNOSIS — Z363 Encounter for antenatal screening for malformations: Secondary | ICD-10-CM | POA: Diagnosis not present

## 2020-05-08 DIAGNOSIS — Z3A24 24 weeks gestation of pregnancy: Secondary | ICD-10-CM | POA: Diagnosis not present

## 2020-06-10 DIAGNOSIS — Z362 Encounter for other antenatal screening follow-up: Secondary | ICD-10-CM | POA: Diagnosis not present

## 2020-06-10 DIAGNOSIS — O30043 Twin pregnancy, dichorionic/diamniotic, third trimester: Secondary | ICD-10-CM | POA: Diagnosis not present

## 2020-06-10 DIAGNOSIS — Z3A29 29 weeks gestation of pregnancy: Secondary | ICD-10-CM | POA: Diagnosis not present

## 2020-06-27 DIAGNOSIS — O30043 Twin pregnancy, dichorionic/diamniotic, third trimester: Secondary | ICD-10-CM | POA: Diagnosis not present

## 2020-06-27 DIAGNOSIS — Z23 Encounter for immunization: Secondary | ICD-10-CM | POA: Diagnosis not present

## 2020-07-10 DIAGNOSIS — O30043 Twin pregnancy, dichorionic/diamniotic, third trimester: Secondary | ICD-10-CM | POA: Diagnosis not present

## 2020-07-10 DIAGNOSIS — O163 Unspecified maternal hypertension, third trimester: Secondary | ICD-10-CM | POA: Diagnosis not present

## 2020-07-15 DIAGNOSIS — Z3A34 34 weeks gestation of pregnancy: Secondary | ICD-10-CM | POA: Diagnosis not present

## 2020-07-15 DIAGNOSIS — O99013 Anemia complicating pregnancy, third trimester: Secondary | ICD-10-CM | POA: Diagnosis not present

## 2020-07-15 DIAGNOSIS — O99213 Obesity complicating pregnancy, third trimester: Secondary | ICD-10-CM | POA: Diagnosis not present

## 2020-07-15 DIAGNOSIS — E669 Obesity, unspecified: Secondary | ICD-10-CM | POA: Diagnosis not present

## 2020-07-15 DIAGNOSIS — Z3A32 32 weeks gestation of pregnancy: Secondary | ICD-10-CM | POA: Diagnosis not present

## 2020-07-15 DIAGNOSIS — O10913 Unspecified pre-existing hypertension complicating pregnancy, third trimester: Secondary | ICD-10-CM | POA: Diagnosis not present

## 2020-07-15 DIAGNOSIS — O133 Gestational [pregnancy-induced] hypertension without significant proteinuria, third trimester: Secondary | ICD-10-CM | POA: Diagnosis not present

## 2020-07-15 DIAGNOSIS — O365932 Maternal care for other known or suspected poor fetal growth, third trimester, fetus 2: Secondary | ICD-10-CM | POA: Diagnosis not present

## 2020-07-15 DIAGNOSIS — O365939 Maternal care for other known or suspected poor fetal growth, third trimester, other fetus: Secondary | ICD-10-CM | POA: Diagnosis not present

## 2020-07-15 DIAGNOSIS — O30043 Twin pregnancy, dichorionic/diamniotic, third trimester: Secondary | ICD-10-CM | POA: Diagnosis not present

## 2020-07-15 DIAGNOSIS — O365930 Maternal care for other known or suspected poor fetal growth, third trimester, not applicable or unspecified: Secondary | ICD-10-CM | POA: Diagnosis not present

## 2020-07-16 DIAGNOSIS — Z3A34 34 weeks gestation of pregnancy: Secondary | ICD-10-CM | POA: Diagnosis not present

## 2020-07-16 DIAGNOSIS — O99013 Anemia complicating pregnancy, third trimester: Secondary | ICD-10-CM | POA: Diagnosis not present

## 2020-07-16 DIAGNOSIS — O365932 Maternal care for other known or suspected poor fetal growth, third trimester, fetus 2: Secondary | ICD-10-CM | POA: Diagnosis not present

## 2020-07-16 DIAGNOSIS — E669 Obesity, unspecified: Secondary | ICD-10-CM | POA: Diagnosis not present

## 2020-07-16 DIAGNOSIS — O30043 Twin pregnancy, dichorionic/diamniotic, third trimester: Secondary | ICD-10-CM | POA: Diagnosis not present

## 2020-07-16 DIAGNOSIS — O133 Gestational [pregnancy-induced] hypertension without significant proteinuria, third trimester: Secondary | ICD-10-CM | POA: Diagnosis not present

## 2020-07-16 DIAGNOSIS — O99213 Obesity complicating pregnancy, third trimester: Secondary | ICD-10-CM | POA: Diagnosis not present

## 2020-07-23 DIAGNOSIS — O365930 Maternal care for other known or suspected poor fetal growth, third trimester, not applicable or unspecified: Secondary | ICD-10-CM | POA: Diagnosis not present

## 2020-07-23 DIAGNOSIS — G4489 Other headache syndrome: Secondary | ICD-10-CM | POA: Diagnosis not present

## 2020-07-23 DIAGNOSIS — U071 COVID-19: Secondary | ICD-10-CM | POA: Diagnosis not present

## 2020-07-23 DIAGNOSIS — I10 Essential (primary) hypertension: Secondary | ICD-10-CM | POA: Diagnosis not present

## 2020-07-23 DIAGNOSIS — D509 Iron deficiency anemia, unspecified: Secondary | ICD-10-CM | POA: Diagnosis not present

## 2020-07-23 DIAGNOSIS — O365932 Maternal care for other known or suspected poor fetal growth, third trimester, fetus 2: Secondary | ICD-10-CM | POA: Diagnosis not present

## 2020-07-23 DIAGNOSIS — O9902 Anemia complicating childbirth: Secondary | ICD-10-CM | POA: Diagnosis not present

## 2020-07-23 DIAGNOSIS — R0902 Hypoxemia: Secondary | ICD-10-CM | POA: Diagnosis not present

## 2020-07-23 DIAGNOSIS — O163 Unspecified maternal hypertension, third trimester: Secondary | ICD-10-CM | POA: Diagnosis not present

## 2020-07-23 DIAGNOSIS — O99214 Obesity complicating childbirth: Secondary | ICD-10-CM | POA: Diagnosis not present

## 2020-07-23 DIAGNOSIS — R519 Headache, unspecified: Secondary | ICD-10-CM | POA: Diagnosis not present

## 2020-07-23 DIAGNOSIS — Z3A35 35 weeks gestation of pregnancy: Secondary | ICD-10-CM | POA: Diagnosis not present

## 2020-07-23 DIAGNOSIS — O1414 Severe pre-eclampsia complicating childbirth: Secondary | ICD-10-CM | POA: Diagnosis not present

## 2020-07-23 DIAGNOSIS — O169 Unspecified maternal hypertension, unspecified trimester: Secondary | ICD-10-CM | POA: Diagnosis not present

## 2020-07-23 DIAGNOSIS — O1493 Unspecified pre-eclampsia, third trimester: Secondary | ICD-10-CM | POA: Diagnosis not present

## 2020-07-23 DIAGNOSIS — O1413 Severe pre-eclampsia, third trimester: Secondary | ICD-10-CM | POA: Diagnosis not present

## 2020-07-23 DIAGNOSIS — O30043 Twin pregnancy, dichorionic/diamniotic, third trimester: Secondary | ICD-10-CM | POA: Diagnosis not present

## 2020-07-23 DIAGNOSIS — E669 Obesity, unspecified: Secondary | ICD-10-CM | POA: Diagnosis not present

## 2020-07-23 DIAGNOSIS — O9852 Other viral diseases complicating childbirth: Secondary | ICD-10-CM | POA: Diagnosis not present

## 2020-07-24 DIAGNOSIS — O98513 Other viral diseases complicating pregnancy, third trimester: Secondary | ICD-10-CM | POA: Diagnosis not present

## 2020-07-24 DIAGNOSIS — U071 COVID-19: Secondary | ICD-10-CM | POA: Diagnosis not present

## 2020-07-24 DIAGNOSIS — G8918 Other acute postprocedural pain: Secondary | ICD-10-CM | POA: Diagnosis not present

## 2020-07-24 DIAGNOSIS — O365930 Maternal care for other known or suspected poor fetal growth, third trimester, not applicable or unspecified: Secondary | ICD-10-CM | POA: Diagnosis not present

## 2020-07-24 DIAGNOSIS — O1414 Severe pre-eclampsia complicating childbirth: Secondary | ICD-10-CM | POA: Diagnosis not present

## 2020-07-24 DIAGNOSIS — O365992 Maternal care for other known or suspected poor fetal growth, unspecified trimester, fetus 2: Secondary | ICD-10-CM | POA: Diagnosis not present

## 2020-07-24 DIAGNOSIS — Z3A35 35 weeks gestation of pregnancy: Secondary | ICD-10-CM | POA: Diagnosis not present

## 2020-07-24 DIAGNOSIS — O163 Unspecified maternal hypertension, third trimester: Secondary | ICD-10-CM | POA: Diagnosis not present

## 2020-07-24 DIAGNOSIS — O9852 Other viral diseases complicating childbirth: Secondary | ICD-10-CM | POA: Diagnosis not present

## 2020-07-24 DIAGNOSIS — R1084 Generalized abdominal pain: Secondary | ICD-10-CM | POA: Diagnosis not present

## 2020-07-24 DIAGNOSIS — O43813 Placental infarction, third trimester: Secondary | ICD-10-CM | POA: Diagnosis not present

## 2020-07-24 DIAGNOSIS — O365991 Maternal care for other known or suspected poor fetal growth, unspecified trimester, fetus 1: Secondary | ICD-10-CM | POA: Diagnosis not present

## 2020-07-24 DIAGNOSIS — O30043 Twin pregnancy, dichorionic/diamniotic, third trimester: Secondary | ICD-10-CM | POA: Diagnosis not present

## 2020-07-24 DIAGNOSIS — O1413 Severe pre-eclampsia, third trimester: Secondary | ICD-10-CM | POA: Diagnosis not present

## 2020-07-25 DIAGNOSIS — O9853 Other viral diseases complicating the puerperium: Secondary | ICD-10-CM | POA: Diagnosis not present

## 2020-07-25 DIAGNOSIS — G8918 Other acute postprocedural pain: Secondary | ICD-10-CM | POA: Diagnosis not present

## 2020-07-25 DIAGNOSIS — R1084 Generalized abdominal pain: Secondary | ICD-10-CM | POA: Diagnosis not present

## 2020-07-25 DIAGNOSIS — O1415 Severe pre-eclampsia, complicating the puerperium: Secondary | ICD-10-CM | POA: Diagnosis not present

## 2020-07-25 DIAGNOSIS — U071 COVID-19: Secondary | ICD-10-CM | POA: Diagnosis not present

## 2020-07-26 DIAGNOSIS — U071 COVID-19: Secondary | ICD-10-CM | POA: Diagnosis not present

## 2020-07-26 DIAGNOSIS — O1415 Severe pre-eclampsia, complicating the puerperium: Secondary | ICD-10-CM | POA: Diagnosis not present

## 2020-07-26 DIAGNOSIS — O9853 Other viral diseases complicating the puerperium: Secondary | ICD-10-CM | POA: Diagnosis not present

## 2020-10-01 ENCOUNTER — Ambulatory Visit: Payer: Medicaid Other

## 2020-11-21 ENCOUNTER — Encounter: Payer: Self-pay | Admitting: Physician Assistant

## 2020-11-21 ENCOUNTER — Ambulatory Visit (LOCAL_COMMUNITY_HEALTH_CENTER): Payer: Medicaid Other | Admitting: Physician Assistant

## 2020-11-21 ENCOUNTER — Other Ambulatory Visit: Payer: Self-pay

## 2020-11-21 VITALS — BP 133/86 | Ht 63.0 in | Wt 181.0 lb

## 2020-11-21 DIAGNOSIS — Z0000 Encounter for general adult medical examination without abnormal findings: Secondary | ICD-10-CM | POA: Diagnosis not present

## 2020-11-21 DIAGNOSIS — Z1331 Encounter for screening for depression: Secondary | ICD-10-CM | POA: Diagnosis not present

## 2020-11-21 DIAGNOSIS — Z3009 Encounter for other general counseling and advice on contraception: Secondary | ICD-10-CM

## 2020-11-21 NOTE — Progress Notes (Signed)
Family Planning Visit- Repeat Yearly Visit  Subjective:  Alison Hebert is a 20 y.o. [phone]  being seen today for an well woman visit and to discuss family planning options.    She is currently using Condoms for pregnancy prevention. Patient reports she does not want a pregnancy in the next year. Patient  has Obesity complicating pregnancy in first trimester; Supervision of normal first pregnancy, antepartum; Positive depression screening; and Dichorionic diamniotic twin pregnancy in first trimester on their problem list.  Chief Complaint  Patient presents with  . Contraception    Patient reports that she is recovering well after delivery and has been formula feeding.  Reports that she took BP meds as directed until she was seen in mid-October for her postpartum visit and told that she could d/c that medicine.  Denies any pain at incision site and reports that it has healed well.  Reports that she is having some symptoms of anxiety and depression and would like a LCSW referral for counseling.  PHQ-9=18, without suicidal or homicidal ideation or plan.  Would like a Nexplanon as her BCM.  Patient denies any other concerns today.    See flowsheet for other program required questions.   Body mass index is 32.06 kg/m. - Patient is eligible for diabetes screening based on BMI and age >44?  not applicable HA1C ordered? not applicable  Patient reports 1 partner in last year. Desires STI screening?  No - patient declines.   Has patient been screened once for HCV in the past?  No  No results found for: HCVAB  Does the patient have current of drug use, have a partner with drug use, and/or has been incarcerated since last result? No  If yes-- Screen for HCV through [facility]   Does the patient meet criteria for HBV testing? No  Criteria:  -Household, sexual or needle sharing contact with HBV -History of drug use -HIV positive -Those with known Hep C   Health Maintenance Due  Topic  Date Due  . Hepatitis C Screening  Never done  . COVID-19 Vaccine (1) Never done  . INFLUENZA VACCINE  06/15/2020  . PAP-Cervical Cytology Screening  Never done  . PAP SMEAR-Modifier  Never done    Review of Systems  All other systems reviewed and are negative.   The following portions of the patient's history were reviewed and updated as appropriate: allergies, current medications, past family history, past medical history, past social history, past surgical history and problem list. Problem list updated.  Objective:   Vitals:   11/21/20 0915  BP: 133/86  Weight: 181 lb (82.1 kg)  Height: 5\' 3"  (1.6 m)    Physical Exam Vitals and nursing note reviewed.  Constitutional:      General: She is not in acute distress.    Appearance: Normal appearance.  HENT:     Head: Normocephalic and atraumatic.  Eyes:     Conjunctiva/sclera: Conjunctivae normal.  Neck:     Thyroid: No thyroid mass, thyromegaly or thyroid tenderness.  Cardiovascular:     Rate and Rhythm: Normal rate and regular rhythm.  Pulmonary:     Effort: Pulmonary effort is normal.     Breath sounds: Normal breath sounds.  Abdominal:     Palpations: Abdomen is soft. There is no mass.     Tenderness: There is no abdominal tenderness. There is no guarding or rebound.     Comments: c-section incision well-healed  Musculoskeletal:     Cervical back: Neck  supple. No tenderness.  Lymphadenopathy:     Cervical: No cervical adenopathy.  Skin:    General: Skin is warm and dry.     Findings: No bruising, erythema, lesion or rash.  Neurological:     Mental Status: She is alert and oriented to person, place, and time.  Psychiatric:        Mood and Affect: Mood normal.        Behavior: Behavior normal.        Thought Content: Thought content normal.        Judgment: Judgment normal.       Assessment and Plan:  Alison Hebert is a 20 y.o. female G2P0202 presenting to the [facility] Department for an  yearly well woman exam/family planning visit  Contraception counseling: Reviewed all forms of birth control options in the tiered based approach. available including abstinence; over the counter/barrier methods; hormonal contraceptive medication including pill, patch, ring, injection,contraceptive implant, ECP; hormonal and nonhormonal IUDs; permanent sterilization options including vasectomy and the various tubal sterilization modalities. Risks, benefits, and typical effectiveness rates were reviewed.  Questions were answered.  Written information was also given to the patient to review.  Patient desires to have a Nexplanon insertion,. She will follow up in  4 days for Nexplanon insertion and prn for surveillance.  She was told to call with any further questions, or with any concerns about this method of contraception.  Emphasized use of condoms 100% of the time for STI prevention.  Patient was not a candidate for ECP today.    1. Encounter for counseling regarding contraception Counseled patient and reviewed risks, benefits and SE of Nexplanon. Enc to abstain or use condoms with all sex until 10 days after insertion.  Offered that patient could stay today for insertion , but patient is not able to stay and opts to schedule another appointment.  2. Well woman exam (no gynecological exam) Reviewed with patient healthy habits to maintain general health. Enc MVI 1 po daily after completing PNV. Enc to establish with/ follow up with PCP for primary care concerns, age appropriate screenings and illness.  3. Positive depression screening Patient with positive PHQ-9 screening today. Per patient request, LCSW referral done and patient provided with LCSW and [employer] cards today. - Ambulatory referral to Behavioral Health     No follow-ups on file.  Future Appointments  Date Time Provider Department Center  11/25/2020  9:00 AM AC-FP PROVIDER AC-FAM None    Matt Holmes, PA

## 2020-11-21 NOTE — Progress Notes (Signed)
Pt reports plans to remain abstinent until she returns for Nexplanon insertion, and Nexplanon insertion scheduled for 11/25/2020 per pt request. Pt aware of appt date and time and to arrive early for check-in and appt card given to pt. Kathreen Cosier, LCSW card and [employer] card with contact info given to pt per pt request and provider order. ROI for [facility] at [facility] (pt reports this is the clinic that she went to) for postpartum physical and pap smear results reviewed and signed by pt, ROI faxed per Sadie Haber, PA verbal order. Fax confirmation received. Counseled pt per provider orders and pt states understanding. Provider orders completed.

## 2020-11-21 NOTE — Progress Notes (Signed)
Pt is here for physical and to start on birth control. Pt had c-section for twins 07/2020. Pt reports since then she had a postpartum physical through [facility] [place]. Pt reports she feels like they did a pap smear at her post partum physical. Pt filling out PHQ9. Pt is interested in getting the Nexplanon. Pt reports had it in the past without any issues.

## 2020-11-25 ENCOUNTER — Encounter: Payer: Self-pay | Admitting: Advanced Practice Midwife

## 2020-11-25 ENCOUNTER — Other Ambulatory Visit: Payer: Self-pay

## 2020-11-25 ENCOUNTER — Ambulatory Visit (LOCAL_COMMUNITY_HEALTH_CENTER): Payer: Medicaid Other | Admitting: Advanced Practice Midwife

## 2020-11-25 VITALS — BP 125/80 | Ht 61.0 in | Wt 184.2 lb

## 2020-11-25 DIAGNOSIS — Z3009 Encounter for other general counseling and advice on contraception: Secondary | ICD-10-CM | POA: Diagnosis not present

## 2020-11-25 DIAGNOSIS — O1494 Unspecified pre-eclampsia, complicating childbirth: Secondary | ICD-10-CM

## 2020-11-25 DIAGNOSIS — Z30017 Encounter for initial prescription of implantable subdermal contraceptive: Secondary | ICD-10-CM

## 2020-11-25 HISTORY — DX: Unspecified pre-eclampsia, complicating childbirth: O14.94

## 2020-11-25 MED ORDER — ETONOGESTREL 68 MG ~~LOC~~ IMPL
68.0000 mg | DRUG_IMPLANT | Freq: Once | SUBCUTANEOUS | Status: AC
  Administered 2020-11-25: 68 mg via SUBCUTANEOUS

## 2020-11-25 NOTE — Progress Notes (Signed)
Contraception/Family Planning VISIT ENCOUNTER NOTE  Subjective:   Alison Hebert is a 20 y.o. [phone] female here for reproductive life counseling.  Desires Nexplanon for BCM.  Reports she does not want a pregnancy in the next year. Denies abnormal vaginal bleeding, discharge, pelvic pain, problems with intercourse or other gynecologic concerns.    Gynecologic History Patient's last menstrual period was 11/15/2020 (exact date).  Last sex 11/01/20 without condom; with current partner x 2 years.  Formula feeding twins and has help from FOB and her mom.  Denies hx cigs, vaping, cigars, MJ.  ROI done for pap at last apt 11/21/20 Contraception: none  Health Maintenance Due  Topic Date Due  . Hepatitis C Screening  Never done  . COVID-19 Vaccine (1) Never done  . INFLUENZA VACCINE  06/15/2020  . PAP-Cervical Cytology Screening  Never done  . PAP SMEAR-Modifier  Never done     The following portions of the patient's history were reviewed and updated as appropriate: allergies, current medications, past family history, past medical history, past social history, past surgical history and problem list.  Review of Systems Pertinent items are noted in HPI.   Objective:  BP 125/80   Ht 5\' 1"  (1.549 m)   Wt 184 lb 3.2 oz (83.6 kg)   LMP 11/15/2020 (Exact Date) Comment: normal period per pt  Breastfeeding No   BMI 34.80 kg/m  Gen: well appearing, NAD HEENT: no scleral icterus CV: RR Lung: Normal WOB Ext: warm well perfused     Assessment and Plan:   Contraception counseling: Reviewed all forms of birth control options in the tiered based approach. available including abstinence; over the counter/barrier methods; hormonal contraceptive medication including pill, patch, ring, injection,contraceptive implant, ECP; hormonal and nonhormonal IUDs; permanent sterilization options including vasectomy and the various tubal sterilization modalities. Risks, benefits, and typical effectiveness rates were  reviewed.  Questions were answered.  Written information was also given to the patient to review.  Patient desires Nexplanon, this was prescribed for patient. She will follow up in 1 year for surveillance.  She was told to call with any further questions, or with any concerns about this method of contraception.  Emphasized use of condoms 100% of the time for STI prevention.  Patient was not offered ECP. ECP was not accepted by the patient. ECP counseling was not given - see RN documentation  1. Family planning Discussion with pt during apt revealed pt with poor bonding with twin infants born 07/24/20 at 36 wks via c/s due to preeclampsia; pt described whole experience "traumatic" and crying with telling of her experience.  She also was + Covid at birth and was isolated from infants for 3 days as a result.  Agrees to referral to 09/23/20, LCSW--done - Ambulatory referral to Behavioral Health  2. Encounter for initial prescription of implantable subdermal contraceptive Nexplanon Insertion Procedure Patient identified, informed consent performed, consent signed.   Patient does understand that irregular bleeding is a very common side effect of this medication. She was advised to have backup contraception after placement. Patient was determined to meet WHO criteria for not being pregnant. Appropriate time out taken.  The insertion site was identified 8-10 cm (3-4 inches) from the medial epicondyle of the humerus and 3-5 cm (1.25-2 inches) posterior to (below) the sulcus (groove) between the biceps and triceps muscles of the patient's left arm and marked.  Patient was prepped with alcohol swab and then injected with 3 ml of 1% lidocaine.  Arm was prepped  with chlorhexidene, Nexplanon removed from packaging,  Device confirmed in needle, then inserted full length of needle and withdrawn per handbook instructions. Nexplanon was able to palpated in the patient's arm; patient palpated the insert herself. There was  minimal blood loss.  Patient insertion site covered with guaze and a pressure bandage to reduce any bruising.  The patient tolerated the procedure well and was given post procedure instructions.  - etonogestrel (NEXPLANON) implant 68 mg    Please refer to After Visit Summary for other counseling recommendations.   Return in about 1 year (around 11/25/2021) for yearly physical exam.  Alberteen Spindle, CNM [facility] DEPARTMENT

## 2020-11-25 NOTE — Progress Notes (Signed)
Patient here for Nexplanon insertion. Had well woman visit on 11/21/2020. Last sex about 1 month ago per patient. Gave birth to twins in 07/2020.Marland KitchenMarland KitchenBurt Knack, RN

## 2020-12-08 ENCOUNTER — Encounter: Payer: Self-pay | Admitting: Licensed Clinical Social Worker

## 2020-12-08 ENCOUNTER — Ambulatory Visit: Payer: Medicaid Other | Admitting: Licensed Clinical Social Worker

## 2020-12-08 DIAGNOSIS — F53 Postpartum depression: Secondary | ICD-10-CM

## 2020-12-08 NOTE — Progress Notes (Signed)
Counselor Initial Adult Exam  Name: Alison Hebert Date: 12/08/2020 MRN: [id number] DOB: 01-17-2001 PCP: Patient, No Pcp Per  Time spent: 1 hour   A biopsychosocial was completed on the Patient. Background information and current concerns were obtained during an intake on Zoom with the [facility] Department clinician, Kathreen Cosier, LCSW.  Contact information and confidentiality was discussed and appropriate consents were signed.     Reason for Visit /Presenting Problem: Patient presents with concerns of depression that began after delivering twins 07/24/20. She reports having a traumatic birthing experience, including having to deliver the twins via c-section one month early. She reports the depression causes low motivation, difficulty in getting along with others and difficulties with bounding to her babies. She reports having some difficulties falling asleep at times due to worries about all the things she needs to get done for her children. And she is getting an average of 4-5 hours of sleep per night. She shares that not only did her birthing plan not go as expected, but she also had to discontinue breastfeeding due to the dietary needs of the babies. Patient reports that the babies were not planned but she was very excited about being a twin mom and had a happy pregnancy. She reports that she and the father of the baby are not together any longer but they did date for about one year and broke up before she found out she was pregnant. She reports that he continues to be involved and helps with the babies. She repots life isn't what she expected and that she really wanted to be able to have her own place to raise her babies but due to financial reasons she hasn't been able to secure her own apartment. She reports a supportive living environment with her mom and 3 younger siblings. Patient reports that she is currently working but outside of working she doesn't do anything out side of  mothering.   Patient reports that she was raised by her father and she continues to have a good relationship with him. She moved with her mom after high school and also reports having a good relationship with her mom and feeling like she has a good support system.   In addition, patient reports that she does have a few friends.   PHQ9 SCORE ONLY 11/21/2020 01/08/2020 12/24/2019  PHQ-9 Total Score 5 11 0   Mental Status Exam:   Appearance:   Casual and Neat     Behavior:  Appropriate and Sharing  Motor:  Normal  Speech/Language:   Normal Rate  Affect:  Appropriate, Congruent, Full Range and Tearful  Mood:  dysthymic and sad  Thought process:  normal  Thought content:    WNL  Sensory/Perceptual disturbances:    WNL  Orientation:  oriented to person, place, time/date, situation and day of week  Attention:  Good  Concentration:  Good  Memory:  WNL  Fund of knowledge:   Good  Insight:    Fair  Judgment:   Fair  Impulse Control:  Fair   Reported Symptoms:  Anhedonia, Sleep disturbance, Lack of motivation and depressed mood  Risk Assessment: Danger to Self:  No Self-injurious Behavior: No Danger to Others: No Duty to Warn:no Physical Aggression / Violence:No  Access to Firearms a concern: No  Gang Involvement:No  Patient / guardian was educated about steps to take if suicide or homicide risk level increases between visits: yes While future psychiatric events cannot be accurately predicted, the patient does not currently  require acute inpatient psychiatric care and does not currently meet [facility] involuntary commitment criteria.  Substance Abuse History: Current substance abuse: No     Past Psychiatric History:   No previous psychological problems have been observed Maternal and paternal grandmothers both have depression history  Outpatient Providers: NA History of Psych Hospitalization: No   Abuse History:  Victim of No. Victim of Neglect:No. Perpetrator of No   Witness / Exposure to Domestic Violence: No   Protective Services Involvement: No  Witness to [employer] Violence:  No   Family History:  Family History  Problem Relation Age of Onset  . Hypertension Mother   . Hypertension Father   . Seizures Maternal Grandmother   . Stroke Maternal Grandmother   . Neurologic Disorder Maternal Grandmother   . Depression Maternal Grandmother   . Hypertension Paternal Grandmother   . Depression Paternal Grandmother   . Hypertension Paternal Grandfather     Social History:  Social History   Socioeconomic History  . Marital status: Single    Spouse name: NA  . Number of children: 2  . Years of education: 16  . Highest education level: High school graduate  Occupational History  . Occupation: Research scientist (life sciences): [employer]  Tobacco Use  . Smoking status: Never Smoker  . Smokeless tobacco: Never Used  Vaping Use  . Vaping Use: Never used  Substance and Sexual Activity  . Alcohol use: Not Currently  . Drug use: Never  . Sexual activity: Yes    Birth control/protection: Condom  Other Topics Concern  . Not on file  Social History Narrative   Patient and her twins live with her mom and her 3 siblings. She reports having a good support system and stable living situation. Patient is currently working.    Social Determinants of Health   Financial Resource Strain: Not on file  Food Insecurity: Food Insecurity Present  . Worried About Programme researcher, broadcasting/film/video in the Last Year: Sometimes true  . Ran Out of Food in the Last Year: Sometimes true  Transportation Needs: Unmet Transportation Needs  . Lack of Transportation (Medical): Yes  . Lack of Transportation (Non-Medical): Yes  Physical Activity: Not on file  Stress: Not on file  Social Connections: Not on file    Living situation: the patient lives with her mom and her siblings   Sexual Orientation:  Straight  Relationship Status: single  Name of spouse / other: NA                If a parent, number of children / ages: twins   Support Systems; parents siblings  Financial Stress:  Yes   Income/Employment/Disability: Employment  Financial planner: No   Educational History: Education: high school diploma/GED  Religion/Sprituality/World View:   none  Any cultural differences that may affect / interfere with treatment:  not applicable   Recreation/Hobbies: No currently  Stressors:Other: transitional issues   Strengths:  Supportive Relationships and Family  Barriers:  NA   Legal History: Pending legal issue / charges: The patient has no significant history of legal issues. History of legal issue / charges: No  Medical History/Surgical History:reviewed Past Medical History:  Diagnosis Date  . Hypertension in pregnancy, preeclampsia, delivered at 36 wks c/s 11/25/2020  . Patient denies medical problems     Past Surgical History:  Procedure Laterality Date  . CESAREAN SECTION    . denies      Medications: Current Outpatient Medications  Medication Sig  Dispense Refill  . aspirin EC 81 MG tablet Take 81 mg by mouth daily. (Patient not taking: No sig reported)    . Prenatal Vit-Fe Fumarate-FA (PRENATAL VITAMINS PO) Take 1 tablet by mouth.     No current facility-administered medications for this visit.   No Known Allergies  Alison Hebert is a 20 y.o. year old female with no reported history of mental health diagnosis. Patient currently presents with depression symptoms that developed post-partum - twin delivery 07/2020. Patient currently describes significant depressive symptoms, including depressed mood, anhedonia, feeling like a failure, and sleep disturbance. Patient denies any suicidal ideation, homicidal ideation, or any psychotic thought patterns. Patient reports that these symptoms significantly impact her functioning in multiple life domains.   Due to the above symptoms and patient's reported history, patient is diagnosed with Major  Depressive Disorder, single episode, Moderate, postpartum onset. Continued mental health treatment is needed to address patient's symptoms and monitor her safety and stability. Patient is recommended for psychiatric medication management evaluation and continued outpatient therapy to further reduce her symptoms and improve her coping strategies.    There is no acute risk for suicide or violence at this time.  While future psychiatric events cannot be accurately predicted, the patient does not require acute inpatient psychiatric care and does not currently meet [facility] involuntary commitment criteria.  Diagnoses:    ICD-10-CM   1. Post-partum depression  O99.345    F53.0     Plan of Care: Patient's goal is to learn different ways to think about things and learn ways to change mood when feeling depressed and over all learnng how to feel better.    -LCSW and patient agreed to develop treatment plan at next session -LCSW provided brief psychoeducation on CBTs. -Provide breif psychoeducation on postpartum mood and anxiety issues.  -LCSW provided information on being evaluated for med management at [facility].   Future Appointments  Date Time Provider Department Center  12/15/2020  9:00 AM Kathreen Cosier, LCSW [facility] None    Kathreen Cosier, [place]

## 2020-12-15 ENCOUNTER — Ambulatory Visit: Payer: Medicaid Other | Admitting: Licensed Clinical Social Worker

## 2020-12-15 NOTE — Progress Notes (Unsigned)
Counselor/Therapist Progress Note  Patient ID: Alison Hebert, MRN: [id number],    Date: 12/15/2020  Time Spent: ***   Treatment Type: {CHL AMB THERAPY [phone]}  Reported Symptoms: {CHL AMB Reported Symptoms:[phone]}  Mental Status Exam:  Appearance:   {PSY:22683}     Behavior:  {PSY:21022743}  Motor:  {PSY:22302}  Speech/Language:   {PSY:22685}  Affect:  {PSY:22687}  Mood:  {PSY:31886}  Thought process:  {PSY:31888}  Thought content:    {[phone]}  Sensory/Perceptual disturbances:    {PSY:772-776-7503}  Orientation:  {PSY:30297}  Attention:  {PSY:22877}  Concentration:  {PSY:5718766664}  Memory:  {PSY:725 301 2380}  Fund of knowledge:   {PSY:5718766664}  Insight:    {PSY:5718766664}  Judgment:   {PSY:5718766664}  Impulse Control:  {PSY:5718766664}   Risk Assessment: Danger to Self:  {PSY:22692} Self-injurious Behavior: {PSY:22692} Danger to Others: {PSY:22692} Duty to Warn:{PSY:311194} Physical Aggression / Violence:{PSY:21197} Access to Firearms a concern: {PSY:21197} Gang Involvement:{PSY:21197}  Subjective: ***   Interventions: {[phone]}  Diagnosis:No diagnosis found.  Plan: Patient's goal is to learn different ways to think about things and learn ways to change mood when feeling depressed and over all learnng how to feel better.    No future appointments.    Kathreen Cosier, LCSW

## 2021-05-23 IMAGING — US US ART/VEN ABD/PELV/SCROTUM DOPPLER LTD
1 series · 13 of 25 positions shown · non-contrast
Comparison: none

CLINICAL DATA: Pregnant patient at 19 weeks 20 gestation with right
lower quadrant pain for 3 days. Evaluate for torsion.

EXAM:
LIMITED OBSTETRIC ULTRASOUND - TWIN WITH DOPPLER OF THE OVARIES

[Series 1: gyn us · 13 of 42 slices shown]
[im 1/42]
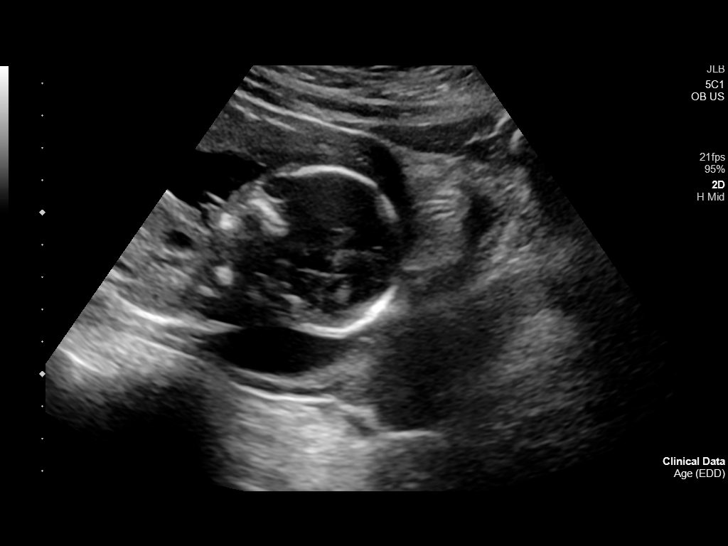
[im 4/42]
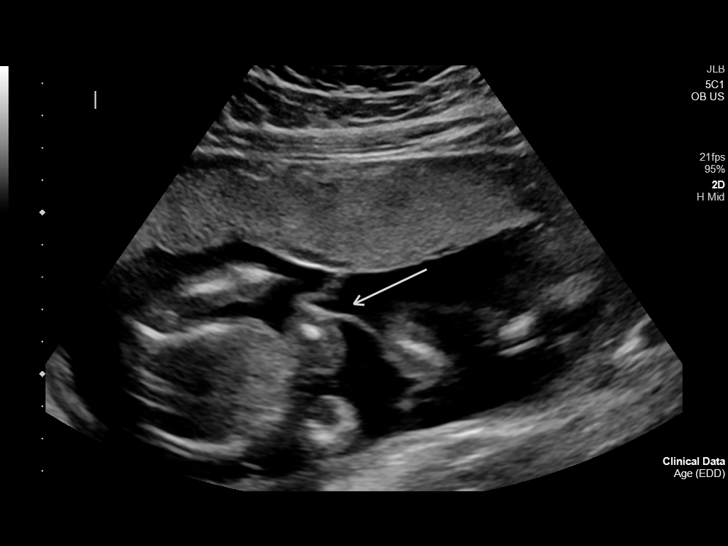
[im 7/42]
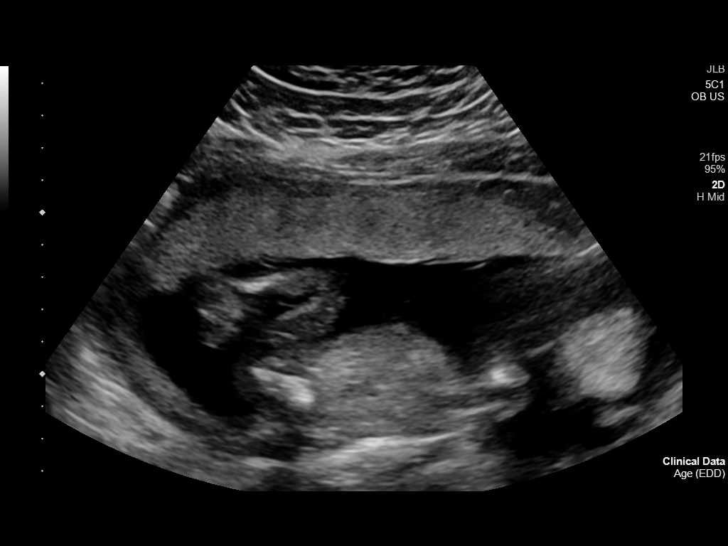
[im 11/42]
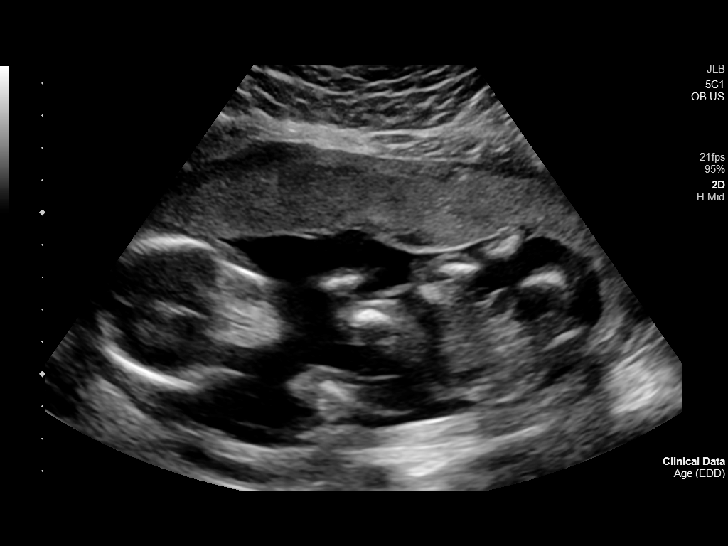
[im 14/42]
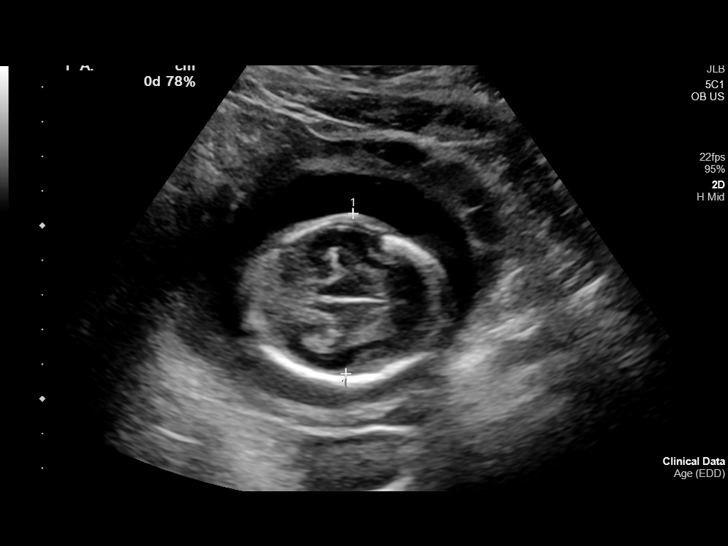
[im 18/42]
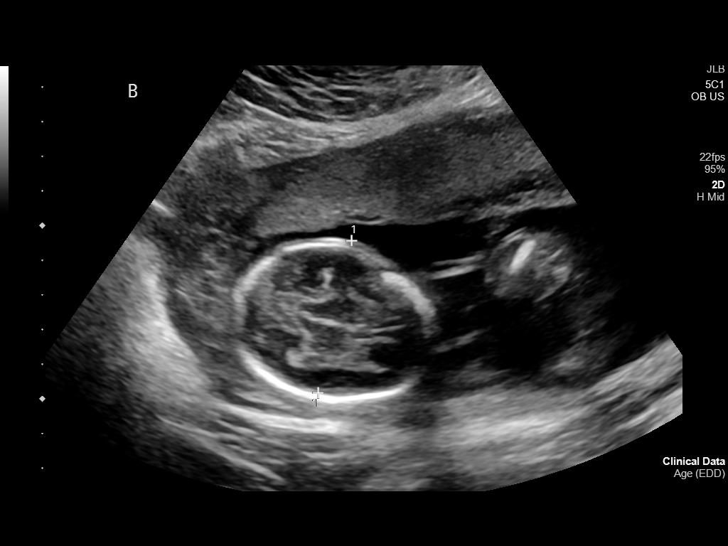
[im 21/42]
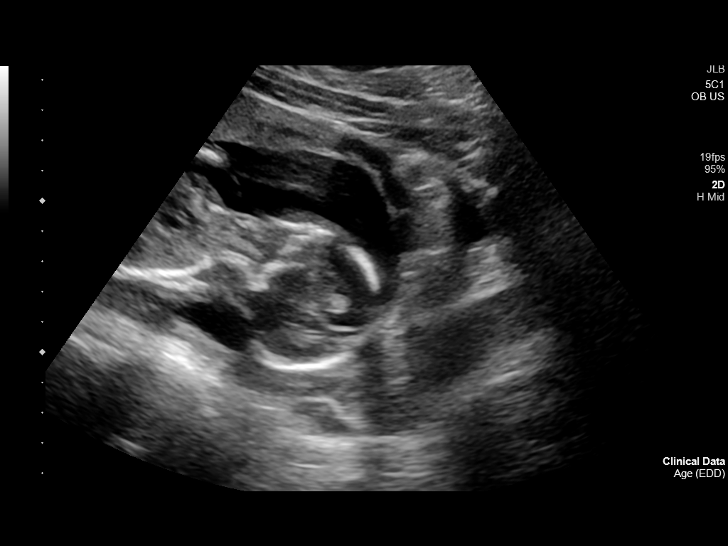
[im 24/42]
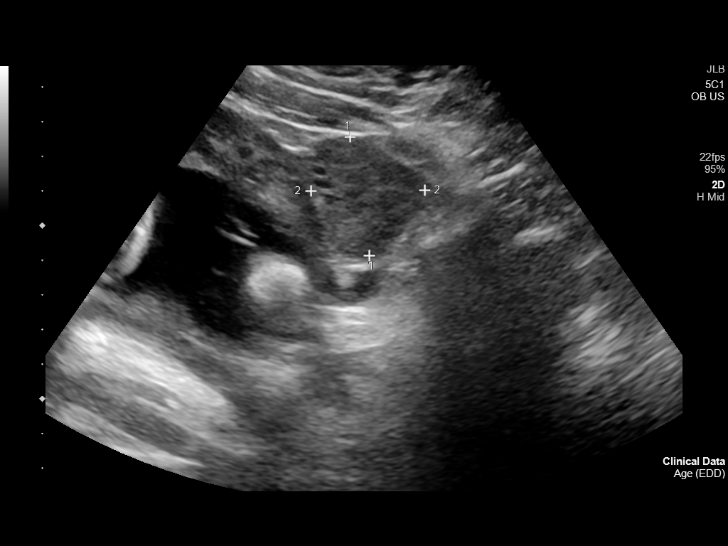
[im 28/42]
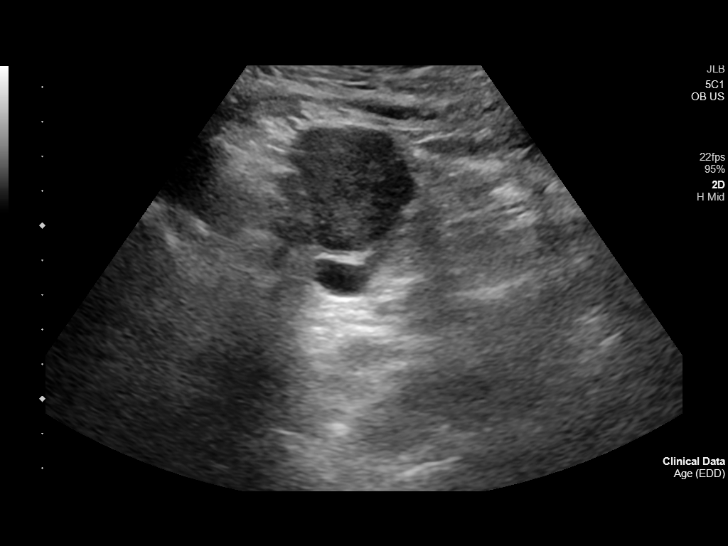
[im 31/42]
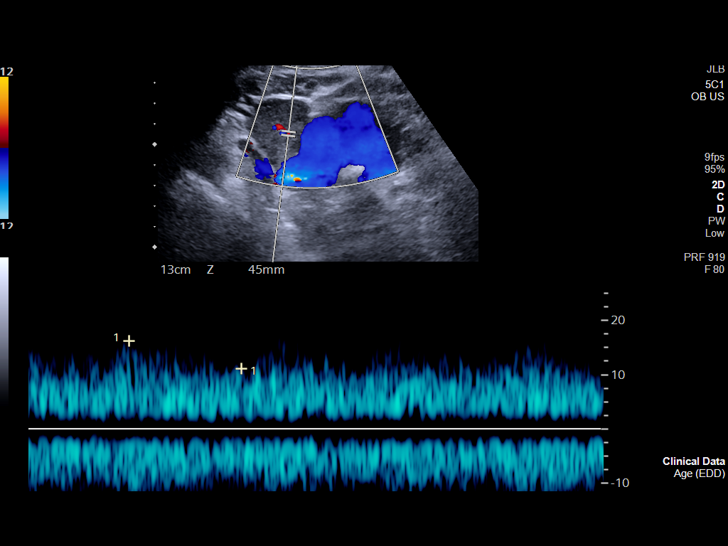
[im 35/42]
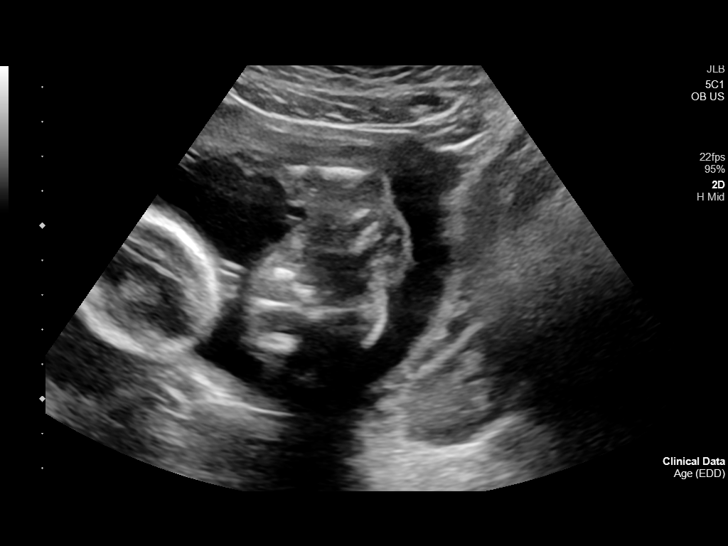
[im 38/42]
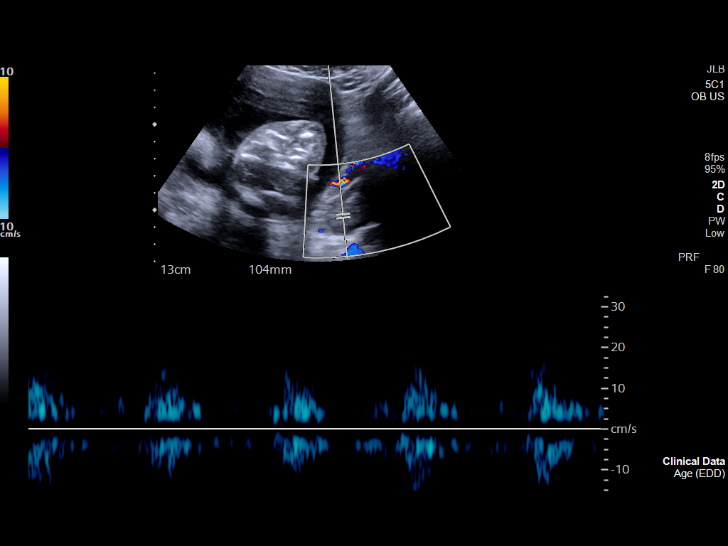
[im 42/42]
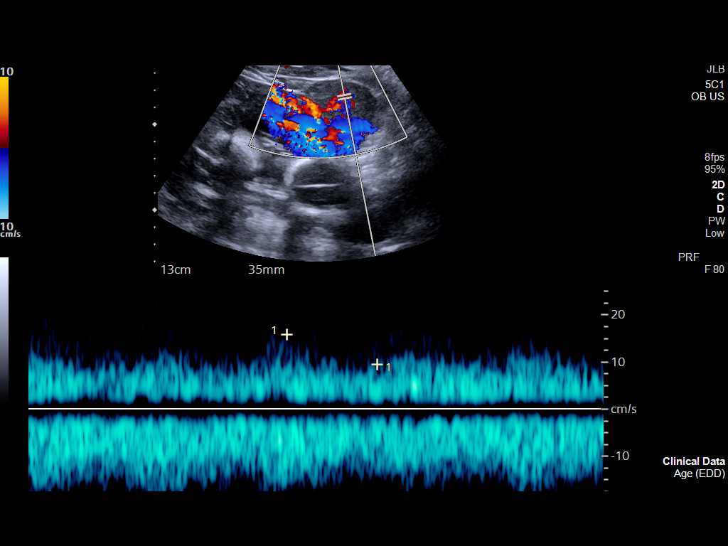

[13 of 25 positions shown; findings below may reference images not displayed]

FINDINGS: Number of Fetuses: 2

Separating Membrane: Visualized

TWIN 1

Heart Rate:  137 bpm

Movement: Yes.

Presentation: Cephalic.

Placental Location: Anterior

Previa: No.

Amniotic Fluid (Subjective): Normal.

BPD:  4.26cm 20w 0d

TWIN 2

Heart Rate:  143 bpm

Movement: Yes

Presentation: Transverse head maternal right.

Placental Location: Anterior

Previa: No.

Amniotic Fluid (Subjective): Normal.

BPD:  4.47cm 19w 4d

MATERNAL FINDINGS:

Cervix:  Appears closed, but is not well visualized.

Uterus/Adnexae: No abnormality visualized.

Right ovary: 3.5 x 3.3 x 3.4 cm. Normal appearance. Blood flow is
noted. No adnexal mass.

Left ovary: 4.3 x 2.4 x 2.1 cm. Normal appearance. Blood flow is
noted. No adnexal mass.

Pulsed Doppler evaluation of both ovaries demonstrates normal
low-resistance arterial and venous waveforms.
IMPRESSION: 1. Viable twin pregnancy with estimated gestational age of fetus A
20 weeks 0 days and fetus B 19 weeks 4 days.
2. Both ovaries appear normal with normal blood flow. No evidence of
torsion.
